# Patient Record
Sex: Female | Born: 1960 | Race: White | Hispanic: No | Smoking: Current every day smoker
Health system: Southern US, Community
[De-identification: ages and names within clinical notes are randomized; demographics above are authoritative.]

## PROBLEM LIST (undated history)

## (undated) DIAGNOSIS — M549 Dorsalgia, unspecified: Secondary | ICD-10-CM

## (undated) DIAGNOSIS — K219 Gastro-esophageal reflux disease without esophagitis: Secondary | ICD-10-CM

## (undated) DIAGNOSIS — H9190 Unspecified hearing loss, unspecified ear: Secondary | ICD-10-CM

## (undated) DIAGNOSIS — J45909 Unspecified asthma, uncomplicated: Secondary | ICD-10-CM

## (undated) DIAGNOSIS — M25569 Pain in unspecified knee: Secondary | ICD-10-CM

## (undated) DIAGNOSIS — F419 Anxiety disorder, unspecified: Secondary | ICD-10-CM

## (undated) DIAGNOSIS — G5600 Carpal tunnel syndrome, unspecified upper limb: Secondary | ICD-10-CM

## (undated) DIAGNOSIS — Z87442 Personal history of urinary calculi: Secondary | ICD-10-CM

## (undated) DIAGNOSIS — E039 Hypothyroidism, unspecified: Secondary | ICD-10-CM

## (undated) DIAGNOSIS — E079 Disorder of thyroid, unspecified: Secondary | ICD-10-CM

## (undated) DIAGNOSIS — I10 Essential (primary) hypertension: Secondary | ICD-10-CM

## (undated) HISTORY — DX: Disorder of thyroid, unspecified: E07.9

## (undated) HISTORY — DX: Essential (primary) hypertension: I10

## (undated) HISTORY — DX: Unspecified hearing loss, unspecified ear: H91.90

## (undated) HISTORY — PX: TUBAL LIGATION: SHX77

## (undated) HISTORY — DX: Pain in unspecified knee: M25.569

---

## 2005-02-06 ENCOUNTER — Emergency Department: Payer: Self-pay | Admitting: Emergency Medicine

## 2011-01-09 ENCOUNTER — Emergency Department: Payer: Self-pay | Admitting: Internal Medicine

## 2011-01-29 ENCOUNTER — Telehealth: Payer: Self-pay | Admitting: Cardiovascular Disease

## 2011-01-29 ENCOUNTER — Ambulatory Visit: Payer: Self-pay | Admitting: Cardiovascular Disease

## 2011-01-29 NOTE — Telephone Encounter (Signed)
LMOM to reschedule missed appointment from 01/29/11.

## 2011-09-11 ENCOUNTER — Ambulatory Visit: Payer: Self-pay | Admitting: "Endocrinology

## 2012-02-09 ENCOUNTER — Encounter: Payer: Self-pay | Admitting: Physical Medicine & Rehabilitation

## 2012-03-01 ENCOUNTER — Ambulatory Visit: Payer: Self-pay | Admitting: Physical Medicine & Rehabilitation

## 2012-03-01 ENCOUNTER — Encounter: Payer: Self-pay | Attending: Physical Medicine & Rehabilitation

## 2012-03-02 LAB — LIPID PANEL
CHOLESTEROL: 229 mg/dL — AB (ref 0–200)
HDL: 43 mg/dL (ref 35–70)
LDL Cholesterol: 115 mg/dL
TRIGLYCERIDES: 356 mg/dL — AB (ref 40–160)

## 2012-03-02 LAB — HEPATIC FUNCTION PANEL
ALK PHOS: 96 U/L (ref 25–125)
ALT: 24 U/L (ref 7–35)
AST: 22 U/L (ref 13–35)
BILIRUBIN, TOTAL: 0.2 mg/dL

## 2012-03-02 LAB — CBC AND DIFFERENTIAL
HCT: 40 % (ref 36–46)
HEMOGLOBIN: 13.3 g/dL (ref 12.0–16.0)
Neutrophils Absolute: 5 /uL
PLATELETS: 374 10*3/uL (ref 150–399)
WBC: 8.8 10^3/mL

## 2012-03-08 ENCOUNTER — Ambulatory Visit: Payer: Self-pay | Admitting: Physical Medicine & Rehabilitation

## 2013-06-17 ENCOUNTER — Emergency Department: Payer: Self-pay | Admitting: Emergency Medicine

## 2013-07-07 LAB — BASIC METABOLIC PANEL
BUN: 11 mg/dL (ref 4–21)
CREATININE: 1 mg/dL (ref 0.5–1.1)
Glucose: 76 mg/dL
POTASSIUM: 3.7 mmol/L (ref 3.4–5.3)
SODIUM: 141 mmol/L (ref 137–147)

## 2013-07-07 LAB — TSH: TSH: 4.08 u[IU]/mL (ref 0.41–5.90)

## 2013-07-29 ENCOUNTER — Ambulatory Visit: Payer: Self-pay | Admitting: Internal Medicine

## 2013-10-18 ENCOUNTER — Emergency Department: Payer: Self-pay | Admitting: Emergency Medicine

## 2013-10-19 LAB — CBC WITH DIFFERENTIAL/PLATELET
Basophil #: 0.2 10*3/uL — ABNORMAL HIGH (ref 0.0–0.1)
Basophil %: 1.1 %
Eosinophil #: 0.3 10*3/uL (ref 0.0–0.7)
Eosinophil %: 1.5 %
HCT: 44.7 % (ref 35.0–47.0)
HGB: 14.7 g/dL (ref 12.0–16.0)
LYMPHS ABS: 8.3 10*3/uL — AB (ref 1.0–3.6)
LYMPHS PCT: 46.8 %
MCH: 28.8 pg (ref 26.0–34.0)
MCHC: 32.9 g/dL (ref 32.0–36.0)
MCV: 88 fL (ref 80–100)
MONOS PCT: 6.9 %
Monocyte #: 1.2 x10 3/mm — ABNORMAL HIGH (ref 0.2–0.9)
NEUTROS PCT: 43.7 %
Neutrophil #: 7.8 10*3/uL — ABNORMAL HIGH (ref 1.4–6.5)
PLATELETS: 515 10*3/uL — AB (ref 150–440)
RBC: 5.11 10*6/uL (ref 3.80–5.20)
RDW: 12.7 % (ref 11.5–14.5)
WBC: 17.7 10*3/uL — ABNORMAL HIGH (ref 3.6–11.0)

## 2013-10-19 LAB — COMPREHENSIVE METABOLIC PANEL
ALK PHOS: 116 U/L
ALT: 30 U/L (ref 12–78)
Albumin: 4.1 g/dL (ref 3.4–5.0)
Anion Gap: 13 (ref 7–16)
BUN: 18 mg/dL (ref 7–18)
Bilirubin,Total: 0.3 mg/dL (ref 0.2–1.0)
CALCIUM: 9.2 mg/dL (ref 8.5–10.1)
Chloride: 101 mmol/L (ref 98–107)
Co2: 24 mmol/L (ref 21–32)
Creatinine: 1.14 mg/dL (ref 0.60–1.30)
EGFR (African American): >60
GFR CALC NON AF AMER: 56 — AB
Glucose: 156 mg/dL — ABNORMAL HIGH (ref 65–99)
OSMOLALITY: 281 (ref 275–301)
POTASSIUM: 3 mmol/L — AB (ref 3.5–5.1)
SGOT(AST): 38 U/L — ABNORMAL HIGH (ref 15–37)
Sodium: 138 mmol/L (ref 136–145)
TOTAL PROTEIN: 8.3 g/dL — AB (ref 6.4–8.2)

## 2013-10-19 LAB — SALICYLATE LEVEL: Salicylates, Serum: 2.4 mg/dL

## 2013-10-19 LAB — DRUG SCREEN, URINE
Amphetamines, Ur Screen: NEGATIVE (ref ?–1000)
BARBITURATES, UR SCREEN: NEGATIVE (ref ?–200)
Benzodiazepine, Ur Scrn: NEGATIVE (ref ?–200)
COCAINE METABOLITE, UR ~~LOC~~: POSITIVE (ref ?–300)
Cannabinoid 50 Ng, Ur ~~LOC~~: NEGATIVE (ref ?–50)
MDMA (Ecstasy)Ur Screen: NEGATIVE (ref ?–500)
Methadone, Ur Screen: NEGATIVE (ref ?–300)
Opiate, Ur Screen: NEGATIVE (ref ?–300)
PHENCYCLIDINE (PCP) UR S: NEGATIVE (ref ?–25)
Tricyclic, Ur Screen: NEGATIVE (ref ?–1000)

## 2013-10-19 LAB — URINALYSIS, COMPLETE
Bilirubin,UR: NEGATIVE
Glucose,UR: NEGATIVE mg/dL (ref 0–75)
Ketone: NEGATIVE
NITRITE: NEGATIVE
PROTEIN: NEGATIVE
Ph: 6 (ref 4.5–8.0)
RBC,UR: 3 /HPF (ref 0–5)
SPECIFIC GRAVITY: 1.003 (ref 1.003–1.030)
Squamous Epithelial: 3

## 2013-10-19 LAB — ACETAMINOPHEN LEVEL: Acetaminophen: <2 ug/mL

## 2013-10-19 LAB — ETHANOL
Ethanol %: 0.041 % (ref 0.000–0.080)
Ethanol: 41 mg/dL

## 2014-05-27 ENCOUNTER — Emergency Department: Payer: Self-pay | Admitting: Emergency Medicine

## 2015-05-19 DIAGNOSIS — I10 Essential (primary) hypertension: Secondary | ICD-10-CM

## 2015-05-19 DIAGNOSIS — E079 Disorder of thyroid, unspecified: Secondary | ICD-10-CM

## 2015-05-30 ENCOUNTER — Ambulatory Visit: Payer: Self-pay | Admitting: Internal Medicine

## 2016-02-14 ENCOUNTER — Emergency Department (HOSPITAL_COMMUNITY): Payer: Medicare Other

## 2016-02-14 ENCOUNTER — Encounter (HOSPITAL_COMMUNITY): Payer: Self-pay | Admitting: Emergency Medicine

## 2016-02-14 ENCOUNTER — Emergency Department (HOSPITAL_COMMUNITY)
Admission: EM | Admit: 2016-02-14 | Discharge: 2016-02-14 | Disposition: A | Discharge status: Home / Self Care | Payer: Medicare Other | Attending: Emergency Medicine | Admitting: Emergency Medicine

## 2016-02-14 DIAGNOSIS — F17210 Nicotine dependence, cigarettes, uncomplicated: Secondary | ICD-10-CM | POA: Diagnosis not present

## 2016-02-14 DIAGNOSIS — M1990 Unspecified osteoarthritis, unspecified site: Secondary | ICD-10-CM | POA: Diagnosis not present

## 2016-02-14 DIAGNOSIS — M159 Polyosteoarthritis, unspecified: Secondary | ICD-10-CM

## 2016-02-14 DIAGNOSIS — Z791 Long term (current) use of non-steroidal anti-inflammatories (NSAID): Secondary | ICD-10-CM | POA: Insufficient documentation

## 2016-02-14 DIAGNOSIS — I10 Essential (primary) hypertension: Secondary | ICD-10-CM | POA: Diagnosis present

## 2016-02-14 DIAGNOSIS — Z79899 Other long term (current) drug therapy: Secondary | ICD-10-CM | POA: Diagnosis not present

## 2016-02-14 HISTORY — DX: Carpal tunnel syndrome, unspecified upper limb: G56.00

## 2016-02-14 HISTORY — DX: Dorsalgia, unspecified: M54.9

## 2016-02-14 MED ORDER — HYDROCHLOROTHIAZIDE 25 MG PO TABS: 25.0000 mg | ORAL_TABLET | Freq: Every day | ORAL | Status: AC

## 2016-02-14 MED ORDER — LISINOPRIL 20 MG PO TABS: 20.0000 mg | ORAL_TABLET | Freq: Every day | ORAL | Status: AC

## 2016-02-14 MED ORDER — LEVOTHYROXINE SODIUM 50 MCG PO TABS: 50.0000 ug | ORAL_TABLET | Freq: Every day | ORAL | Status: AC

## 2016-02-14 MED ORDER — HYDROCHLOROTHIAZIDE 25 MG PO TABS
25.0000 mg | ORAL_TABLET | Freq: Every day | ORAL | Status: DC
  Administered 2016-02-14: 25 mg via ORAL
  Filled 2016-02-14: qty 1

## 2016-02-14 MED ORDER — LISINOPRIL 10 MG PO TABS
20.0000 mg | ORAL_TABLET | Freq: Every day | ORAL | Status: DC
  Administered 2016-02-14: 20 mg via ORAL
  Filled 2016-02-14: qty 2

## 2016-02-14 NOTE — ED Notes (Signed)
PT c/o not having PCP at this time and no HTN medications for over a month and c/o HTN with headache and increased anxiety and neverousness since her husband committed suicide 2 months ago.

## 2016-02-14 NOTE — Discharge Instructions (Signed)
Osteoarthritis Osteoarthritis is a disease that causes soreness and inflammation of a joint. It occurs when the cartilage at the affected joint wears down. Cartilage acts as a cushion, covering the ends of bones where they meet to form a joint. Osteoarthritis is the most common form of arthritis. It often occurs in older people. The joints affected most often by this condition include those in the:  Ends of the fingers.  Thumbs.  Neck.  Lower back.  Knees.  Hips. CAUSES  Over time, the cartilage that covers the ends of bones begins to wear away. This causes bone to rub on bone, producing pain and stiffness in the affected joints.  RISK FACTORS Certain factors can increase your chances of having osteoarthritis, including:  Older age.  Excessive body weight.  Overuse of joints.  Previous joint injury. SIGNS AND SYMPTOMS   Pain, swelling, and stiffness in the joint.  Over time, the joint may lose its normal shape.  Small deposits of bone (osteophytes) may grow on the edges of the joint.  Bits of bone or cartilage can break off and float inside the joint space. This may cause more pain and damage. DIAGNOSIS  Your health care provider will do a physical exam and ask about your symptoms. Various tests may be ordered, such as:  X-rays of the affected joint.  Blood tests to rule out other types of arthritis. Additional tests may be used to diagnose your condition. TREATMENT  Goals of treatment are to control pain and improve joint function. Treatment plans may include:  A prescribed exercise program that allows for rest and joint relief.  A weight control plan.  Pain relief techniques, such as:  Properly applied heat and cold.  Electric pulses delivered to nerve endings under the skin (transcutaneous electrical nerve stimulation [TENS]).  Massage.  Certain nutritional supplements.  Medicines to control pain, such as:  Acetaminophen.  Nonsteroidal  anti-inflammatory drugs (NSAIDs), such as naproxen.  Narcotic or central-acting agents, such as tramadol.  Corticosteroids. These can be given orally or as an injection.  Surgery to reposition the bones and relieve pain (osteotomy) or to remove loose pieces of bone and cartilage. Joint replacement may be needed in advanced states of osteoarthritis. HOME CARE INSTRUCTIONS   Take medicines only as directed by your health care provider.  Maintain a healthy weight. Follow your health care provider's instructions for weight control. This may include dietary instructions.  Exercise as directed. Your health care provider can recommend specific types of exercise. These may include:  Strengthening exercises. These are done to strengthen the muscles that support joints affected by arthritis. They can be performed with weights or with exercise bands to add resistance.  Aerobic activities. These are exercises, such as brisk walking or low-impact aerobics, that get your heart pumping.  Range-of-motion activities. These keep your joints limber.  Balance and agility exercises. These help you maintain daily living skills.  Rest your affected joints as directed by your health care provider.  Keep all follow-up visits as directed by your health care provider. SEEK MEDICAL CARE IF:   Your skin turns red.  You develop a rash in addition to your joint pain.  You have worsening joint pain.  You have a fever along with joint or muscle aches. SEEK IMMEDIATE MEDICAL CARE IF:  You have a significant loss of weight or appetite.  You have night sweats. FOR MORE INFORMATION   National Institute of Arthritis and Musculoskeletal and Skin Diseases: www.niams.nih.gov  National Institute on   Aging: www.nia.nih.gov  American College of Rheumatology: www.rheumatology.org   This information is not intended to replace advice given to you by your health care provider. Make sure you discuss any questions you  have with your health care provider.   Document Released: 09/15/2005 Document Revised: 10/06/2014 Document Reviewed: 05/23/2013 Elsevier Interactive Patient Education 2016 Elsevier Inc. Hypertension Hypertension, commonly called high blood pressure, is when the force of blood pumping through your arteries is too strong. Your arteries are the blood vessels that carry blood from your heart throughout your body. A blood pressure reading consists of a higher number over a lower number, such as 110/72. The higher number (systolic) is the pressure inside your arteries when your heart pumps. The lower number (diastolic) is the pressure inside your arteries when your heart relaxes. Ideally you want your blood pressure below 120/80. Hypertension forces your heart to work harder to pump blood. Your arteries may become narrow or stiff. Having untreated or uncontrolled hypertension can cause heart attack, stroke, kidney disease, and other problems. RISK FACTORS Some risk factors for high blood pressure are controllable. Others are not.  Risk factors you cannot control include:   Race. You may be at higher risk if you are African American.  Age. Risk increases with age.  Gender. Men are at higher risk than women before age 45 years. After age 65, women are at higher risk than men. Risk factors you can control include:  Not getting enough exercise or physical activity.  Being overweight.  Getting too much fat, sugar, calories, or salt in your diet.  Drinking too much alcohol. SIGNS AND SYMPTOMS Hypertension does not usually cause signs or symptoms. Extremely high blood pressure (hypertensive crisis) may cause headache, anxiety, shortness of breath, and nosebleed. DIAGNOSIS To check if you have hypertension, your health care provider will measure your blood pressure while you are seated, with your arm held at the level of your heart. It should be measured at least twice using the same arm. Certain  conditions can cause a difference in blood pressure between your right and left arms. A blood pressure reading that is higher than normal on one occasion does not mean that you need treatment. If it is not clear whether you have high blood pressure, you may be asked to return on a different day to have your blood pressure checked again. Or, you may be asked to monitor your blood pressure at home for 1 or more weeks. TREATMENT Treating high blood pressure includes making lifestyle changes and possibly taking medicine. Living a healthy lifestyle can help lower high blood pressure. You may need to change some of your habits. Lifestyle changes may include:  Following the DASH diet. This diet is high in fruits, vegetables, and whole grains. It is low in salt, red meat, and added sugars.  Keep your sodium intake below 2,300 mg per day.  Getting at least 30-45 minutes of aerobic exercise at least 4 times per week.  Losing weight if necessary.  Not smoking.  Limiting alcoholic beverages.  Learning ways to reduce stress. Your health care provider may prescribe medicine if lifestyle changes are not enough to get your blood pressure under control, and if one of the following is true:  You are 18-59 years of age and your systolic blood pressure is above 140.  You are 60 years of age or older, and your systolic blood pressure is above 150.  Your diastolic blood pressure is above 90.  You have diabetes, and your   systolic blood pressure is over 140 or your diastolic blood pressure is over 90.  You have kidney disease and your blood pressure is above 140/90.  You have heart disease and your blood pressure is above 140/90. Your personal target blood pressure may vary depending on your medical conditions, your age, and other factors. HOME CARE INSTRUCTIONS  Have your blood pressure rechecked as directed by your health care provider.   Take medicines only as directed by your health care provider.  Follow the directions carefully. Blood pressure medicines must be taken as prescribed. The medicine does not work as well when you skip doses. Skipping doses also puts you at risk for problems.  Do not smoke.   Monitor your blood pressure at home as directed by your health care provider. SEEK MEDICAL CARE IF:   You think you are having a reaction to medicines taken.  You have recurrent headaches or feel dizzy.  You have swelling in your ankles.  You have trouble with your vision. SEEK IMMEDIATE MEDICAL CARE IF:  You develop a severe headache or confusion.  You have unusual weakness, numbness, or feel faint.  You have severe chest or abdominal pain.  You vomit repeatedly.  You have trouble breathing. MAKE SURE YOU:   Understand these instructions.  Will watch your condition.  Will get help right away if you are not doing well or get worse.   This information is not intended to replace advice given to you by your health care provider. Make sure you discuss any questions you have with your health care provider.   Document Released: 09/15/2005 Document Revised: 01/30/2015 Document Reviewed: 07/08/2013 Elsevier Interactive Patient Education 2016 Elsevier Inc.  

## 2016-02-14 NOTE — ED Provider Notes (Signed)
CSN: [id number]     Arrival date & time 02/14/16  1630 History   First MD Initiated Contact with Patient 02/14/16 1652     Chief Complaint  Patient presents with  . Hypertension    HPI Patient presents to the emergency room to get refills of her blood pressure medications. Patient has a history of hypertension. Her husband committed suicide 2 months ago.  She was not able to follow-up with her primary care doctor and there were some issues with her insurance.  The patient has not taken her medications for over a month. He has been noticing some increasing headaches. She's also had some issues with anxiety since her husband passed away. She also mentions having some issues with chronic left hip and knee pain that started several years ago. Denies any fevers. No chest pain or shortness of breath. No numbness or weakness. Patient took her blood pressure recently and was very elevated and she realized she needed to get back on her medications so she came to the emergency room because she was not sure where she could go. Past Medical History  Diagnosis Date  . Hypertension   . Knee pain   . Hearing impaired birth    Mother had rubella  . Thyroid disease   . Carpal tunnel syndrome   . Back pain    Past Surgical History  Procedure Laterality Date  . Tubal ligation  20 + years   Family History  Problem Relation Age of Onset  . Hypertension Mother   . Diabetes Mother   . Congestive Heart Failure Mother   . Hypertension Father   . CVA Father   . Hypertension Brother    Social History  Substance Use Topics  . Smoking status: Current Every Day Smoker -- 0.50 packs/day    Types: Cigarettes  . Smokeless tobacco: Never Used  . Alcohol Use: Yes   OB History    Gravida Para Term Preterm AB TAB SAB Ectopic Multiple Living            2     Review of Systems  All other systems reviewed and are negative.     Allergies  Penicillins and Sulfur  Home Medications   Prior to Admission  medications   Medication Sig Start Date End Date Taking? Authorizing Provider  acetaminophen (TYLENOL) 500 MG tablet Take 500 mg by mouth every 6 (six) hours as needed for mild pain or moderate pain.   Yes Historical Provider, MD  ibuprofen (ADVIL,MOTRIN) 200 MG tablet Take 200 mg by mouth every 6 (six) hours as needed for mild pain or moderate pain.   Yes Historical Provider, MD  hydrochlorothiazide (HYDRODIURIL) 25 MG tablet Take 1 tablet (25 mg total) by mouth daily. 02/14/16   Linwood DibblesJon Nicloe Frontera, MD  levothyroxine (SYNTHROID, LEVOTHROID) 50 MCG tablet Take 1 tablet (50 mcg total) by mouth daily before breakfast. 02/14/16   Linwood DibblesJon Story Conti, MD  lisinopril (PRINIVIL,ZESTRIL) 20 MG tablet Take 1 tablet (20 mg total) by mouth daily. 02/14/16   Linwood DibblesJon Krimson Massmann, MD  naproxen (NAPROSYN) 250 MG tablet Take by mouth 3 (three) times daily with meals.    Historical Provider, MD  ranitidine (ZANTAC) 150 MG tablet Take 150 mg by mouth 2 (two) times daily.    Historical Provider, MD   BP 187/96 mmHg  Pulse 88  Temp(Src) 98.4 F (36.9 C) (Oral)  Resp 16  Wt 70.308 kg  SpO2 99% Physical Exam  Constitutional: She appears well-developed and well-nourished. No distress.  HENT:  Head: Normocephalic and atraumatic.  Right Ear: External ear normal.  Left Ear: External ear normal.  Eyes: Conjunctivae are normal. Right eye exhibits no discharge. Left eye exhibits no discharge. No scleral icterus.  Neck: Neck supple. No tracheal deviation present.  Cardiovascular: Normal rate, regular rhythm and intact distal pulses.   Pulmonary/Chest: Effort normal and breath sounds normal. No stridor. No respiratory distress. She has no wheezes. She has no rales.  Abdominal: Soft. Bowel sounds are normal. She exhibits no distension. There is no tenderness. There is no rebound and no guarding.  Musculoskeletal: She exhibits no edema.  Mild tenderness palpation and pain with range of motion of the left hip and left knee  Neurological: She is  alert. She has normal strength. No cranial nerve deficit (no facial droop, extraocular movements intact, no slurred speech) or sensory deficit. She exhibits normal muscle tone. She displays no seizure activity. Coordination normal.  Skin: Skin is warm and dry. No rash noted.  Psychiatric: She has a normal mood and affect.  Nursing note and vitals reviewed.   ED Course  Procedures (including critical care time) Labs Review Labs Reviewed - No data to display  Imaging Review Dg Knee Complete 4 Views Left  02/14/2016  CLINICAL DATA:  Left hip pain radiating to the knee. EXAM: LEFT KNEE - COMPLETE 4+ VIEW COMPARISON:  None. FINDINGS: Sharpening of the tibial spines noted. There is medial compartment narrowing and marginal spur formation. No acute fracture or subluxation identified. IMPRESSION: 1. Osteoarthritis. 2. No acute findings. Electronically Signed   By: Signa Kell M.D.   On: 02/14/2016 17:53   Dg Hip Unilat With Pelvis 2-3 Views Left  02/14/2016  CLINICAL DATA:  Left hip pain EXAM: DG HIP (WITH OR WITHOUT PELVIS) 2-3V LEFT COMPARISON:  None. FINDINGS: There is mild left hip osteoarthritis with joint space narrowing and marginal spur formation. There is no acute fracture or subluxation identified. No radio-opaque foreign body or soft tissue calcification. IMPRESSION: 1. Left hip osteoarthritis. Electronically Signed   By: Signa Kell M.D.   On: 02/14/2016 17:54   I have personally reviewed and evaluated these images and lab results as part of my medical decision-making.    MDM   Final diagnoses:  Essential hypertension  Osteoarthritis of multiple joints, unspecified osteoarthritis type    Patient's x-rays do show evidence of osteoarthritis. Patient can take over-the-counter pain medications.  She has asymptomatic hypertension. She was given a dose of her prior blood pressure medications this evening. I will give her prescriptions for her to take home. [facility] [place], MD 02/14/16 1816

## 2016-05-07 ENCOUNTER — Other Ambulatory Visit (HOSPITAL_COMMUNITY): Payer: Self-pay | Admitting: Family Medicine

## 2016-05-07 DIAGNOSIS — Z1231 Encounter for screening mammogram for malignant neoplasm of breast: Secondary | ICD-10-CM

## 2016-05-14 ENCOUNTER — Ambulatory Visit (HOSPITAL_COMMUNITY): Payer: Medicare Other

## 2016-05-16 ENCOUNTER — Ambulatory Visit (HOSPITAL_COMMUNITY): Payer: Medicare Other

## 2016-05-19 ENCOUNTER — Ambulatory Visit (HOSPITAL_COMMUNITY): Payer: Medicare Other

## 2016-08-29 DIAGNOSIS — Z87442 Personal history of urinary calculi: Secondary | ICD-10-CM

## 2016-08-29 HISTORY — DX: Personal history of urinary calculi: Z87.442

## 2016-10-22 ENCOUNTER — Other Ambulatory Visit: Payer: Self-pay | Admitting: Specialist

## 2016-10-22 DIAGNOSIS — M5416 Radiculopathy, lumbar region: Secondary | ICD-10-CM

## 2016-10-30 ENCOUNTER — Ambulatory Visit
Admission: RE | Admit: 2016-10-30 | Discharge: 2016-10-30 | Disposition: A | Discharge status: Home / Self Care | Payer: Medicare Other | Source: Ambulatory Visit | Attending: Specialist | Admitting: Specialist

## 2016-10-30 DIAGNOSIS — M5126 Other intervertebral disc displacement, lumbar region: Secondary | ICD-10-CM | POA: Insufficient documentation

## 2016-10-30 DIAGNOSIS — M5416 Radiculopathy, lumbar region: Secondary | ICD-10-CM | POA: Diagnosis not present

## 2016-12-22 ENCOUNTER — Ambulatory Visit
Admission: RE | Admit: 2016-12-22 | Discharge: 2016-12-22 | Disposition: A | Discharge status: Home / Self Care | Payer: Medicare Other | Source: Ambulatory Visit | Attending: Neurological Surgery | Admitting: Neurological Surgery

## 2016-12-22 ENCOUNTER — Encounter
Admission: RE | Admit: 2016-12-22 | Discharge: 2016-12-22 | Disposition: A | Discharge status: Home / Self Care | Payer: Medicare Other | Source: Ambulatory Visit | Attending: Neurological Surgery | Admitting: Neurological Surgery

## 2016-12-22 DIAGNOSIS — I700 Atherosclerosis of aorta: Secondary | ICD-10-CM | POA: Diagnosis not present

## 2016-12-22 DIAGNOSIS — Z79899 Other long term (current) drug therapy: Secondary | ICD-10-CM | POA: Insufficient documentation

## 2016-12-22 DIAGNOSIS — I10 Essential (primary) hypertension: Secondary | ICD-10-CM | POA: Diagnosis present

## 2016-12-22 DIAGNOSIS — I447 Left bundle-branch block, unspecified: Secondary | ICD-10-CM | POA: Insufficient documentation

## 2016-12-22 HISTORY — DX: Anxiety disorder, unspecified: F41.9

## 2016-12-22 HISTORY — DX: Unspecified asthma, uncomplicated: J45.909

## 2016-12-22 HISTORY — DX: Gastro-esophageal reflux disease without esophagitis: K21.9

## 2016-12-22 HISTORY — DX: Personal history of urinary calculi: Z87.442

## 2016-12-22 HISTORY — DX: Hypothyroidism, unspecified: E03.9

## 2016-12-22 LAB — CBC
HCT: 42.5 % (ref 35.0–47.0)
Hemoglobin: 14.3 g/dL (ref 12.0–16.0)
MCH: 29.3 pg (ref 26.0–34.0)
MCHC: 33.8 g/dL (ref 32.0–36.0)
MCV: 86.9 fL (ref 80.0–100.0)
PLATELETS: 352 10*3/uL (ref 150–440)
RBC: 4.89 MIL/uL (ref 3.80–5.20)
RDW: 13.5 % (ref 11.5–14.5)
WBC: 9.2 10*3/uL (ref 3.6–11.0)

## 2016-12-22 LAB — PROTIME-INR
INR: 0.93
PROTHROMBIN TIME: 12.5 s (ref 11.4–15.2)

## 2016-12-22 LAB — DIFFERENTIAL
BASOS PCT: 0 %
Basophils Absolute: 0 10*3/uL (ref 0–0.1)
EOS ABS: 0.1 10*3/uL (ref 0–0.7)
Eosinophils Relative: 1 %
Lymphocytes Relative: 30 %
Lymphs Abs: 2.7 10*3/uL (ref 1.0–3.6)
Monocytes Absolute: 0.6 10*3/uL (ref 0.2–0.9)
Monocytes Relative: 7 %
NEUTROS ABS: 5.7 10*3/uL (ref 1.4–6.5)
Neutrophils Relative %: 62 %

## 2016-12-22 LAB — BASIC METABOLIC PANEL
ANION GAP: 8 (ref 5–15)
BUN: 17 mg/dL (ref 6–20)
CALCIUM: 9.8 mg/dL (ref 8.9–10.3)
CO2: 24 mmol/L (ref 22–32)
Chloride: 109 mmol/L (ref 101–111)
Creatinine, Ser: 0.78 mg/dL (ref 0.44–1.00)
GFR calc Af Amer: >60 mL/min (ref >60)
Glucose, Bld: 89 mg/dL (ref 65–99)
POTASSIUM: 3.7 mmol/L (ref 3.5–5.1)
SODIUM: 141 mmol/L (ref 135–145)

## 2016-12-22 LAB — SURGICAL PCR SCREEN
MRSA, PCR: NEGATIVE
Staphylococcus aureus: NEGATIVE

## 2016-12-22 LAB — URINALYSIS, ROUTINE W REFLEX MICROSCOPIC
BILIRUBIN URINE: NEGATIVE
Glucose, UA: NEGATIVE mg/dL
HGB URINE DIPSTICK: NEGATIVE
KETONES UR: NEGATIVE mg/dL
Leukocytes, UA: NEGATIVE
Nitrite: NEGATIVE
PROTEIN: NEGATIVE mg/dL
Specific Gravity, Urine: 1.02 (ref 1.005–1.030)
pH: 5 (ref 5.0–8.0)

## 2016-12-22 LAB — APTT: APTT: 30 s (ref 24–36)
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
## 2016-12-22 NOTE — Patient Instructions (Signed)
  Your procedure is scheduled ZO:XWRUEAon:Monday April 2 , 2018. Report to Same Day Surgery at 9:00 am.   Remember: Instructions that are not followed completely may result in serious medical risk, up to and including death, or upon the discretion of your surgeon and anesthesiologist your surgery may need to be rescheduled.    _x___ 1. Do not eat food or drink liquids after midnight. No gum chewing or hard candies.     _x___ 2. No Alcohol for 24 hours before or after surgery.   ____ 3. Bring all medications with you on the day of surgery if instructed.    __x__ 4. Notify your doctor if there is any change in your medical condition     (cold, fever, infections).    __x___ 5. No smoking 24 hours prior to surgery.     Do not wear jewelry, make-up, hairpins, clips or nail polish.  Do not wear lotions, powders, or perfumes.   Do not shave 48 hours prior to surgery. Men may shave face and neck.  Do not bring valuables to the hospital.    [facility] is not responsible for any belongings or valuables.               Contacts, dentures or bridgework may not be worn into surgery.  Leave your suitcase in the car. After surgery it may be brought to your room.  For patients admitted to the hospital, discharge time is determined by your treatment team.   Patients discharged the day of surgery will not be allowed to drive home.    Please read over the following fact sheets that you were given:   [facility] Pre[facility]  __x__ Take these medicines the morning of surgery with A SIP OF WATER:    1. busPIRone (BUSPAR)  2. levothyroxine (SYNTHROID, LEVOTHROID)   3. lisinopril (PRINIVIL,ZESTRIL)   ____ Fleet Enema (as directed)   _x___ Use CHG Soap as directed on instruction sheet  _x_ Use formoterol (PERFOROMIST)  on the day of surgery and bring to hospital day of surgery  ____ Stop metformin 2 days prior to surgery    ____ Take 1/2 of usual insulin dose the night before surgery and none  on the morning of surgery.   ____ Stop Coumadin/Plavix/aspirin on does not apply.  __x_ Stop Anti-inflammatories such as Advil, Aleve, Ibuprofen, Motrin, Naproxen,  Naprosyn, Goodies powders or aspirin products. OK to take Tylenol.   ____ Stop supplements until after surgery.    ____ Bring C-Pap to the hospital.

## 2016-12-22 NOTE — Pre-Procedure Instructions (Signed)
Spoke with Dr. Randa NgoPiscitello regarding today's EKG compared to EKG done in 2015.  Dr. Randa NgoPiscitello is requesting a medical clearance.  Request sent to Dr. Chipper HerbBarr's office attention Royston CowperKendelyn.  Message left on Kendelyn's voicemail.

## 2016-12-23 NOTE — Pre-Procedure Instructions (Signed)
Message [facility] from [facility] at Dr. Chipper HerbBarr's office, Medical clearance has [facility] and they are awaiting clearance.

## 2016-12-25 NOTE — Pre-Procedure Instructions (Signed)
Clearance note received from Franco Nonesheryl Lindley, FNP, Medium risk.

## 2016-12-28 MED ORDER — FAMOTIDINE 20 MG PO TABS
20.0000 mg | ORAL_TABLET | Freq: Once | ORAL | Status: AC
  Administered 2016-12-29: 20 mg via ORAL

## 2016-12-29 ENCOUNTER — Ambulatory Visit: Payer: Medicare Other | Admitting: Anesthesiology

## 2016-12-29 ENCOUNTER — Encounter: Payer: Self-pay | Admitting: *Deleted

## 2016-12-29 ENCOUNTER — Encounter
Admission: RE | Disposition: A | Discharge status: Home / Self Care | Payer: Self-pay | Source: Ambulatory Visit | Attending: Neurological Surgery

## 2016-12-29 ENCOUNTER — Ambulatory Visit: Payer: Medicare Other

## 2016-12-29 ENCOUNTER — Ambulatory Visit
Admission: RE | Admit: 2016-12-29 | Discharge: 2016-12-29 | Disposition: A | Discharge status: Home / Self Care | Payer: Medicare Other | Source: Ambulatory Visit | Attending: Neurological Surgery | Admitting: Neurological Surgery

## 2016-12-29 DIAGNOSIS — H905 Unspecified sensorineural hearing loss: Secondary | ICD-10-CM | POA: Diagnosis not present

## 2016-12-29 DIAGNOSIS — Z419 Encounter for procedure for purposes other than remedying health state, unspecified: Secondary | ICD-10-CM

## 2016-12-29 DIAGNOSIS — Z7952 Long term (current) use of systemic steroids: Secondary | ICD-10-CM | POA: Insufficient documentation

## 2016-12-29 DIAGNOSIS — Z791 Long term (current) use of non-steroidal anti-inflammatories (NSAID): Secondary | ICD-10-CM | POA: Insufficient documentation

## 2016-12-29 DIAGNOSIS — K219 Gastro-esophageal reflux disease without esophagitis: Secondary | ICD-10-CM | POA: Insufficient documentation

## 2016-12-29 DIAGNOSIS — I10 Essential (primary) hypertension: Secondary | ICD-10-CM | POA: Diagnosis not present

## 2016-12-29 DIAGNOSIS — F17200 Nicotine dependence, unspecified, uncomplicated: Secondary | ICD-10-CM | POA: Diagnosis not present

## 2016-12-29 DIAGNOSIS — E039 Hypothyroidism, unspecified: Secondary | ICD-10-CM | POA: Insufficient documentation

## 2016-12-29 DIAGNOSIS — F419 Anxiety disorder, unspecified: Secondary | ICD-10-CM | POA: Insufficient documentation

## 2016-12-29 DIAGNOSIS — Z79899 Other long term (current) drug therapy: Secondary | ICD-10-CM | POA: Insufficient documentation

## 2016-12-29 DIAGNOSIS — M5116 Intervertebral disc disorders with radiculopathy, lumbar region: Secondary | ICD-10-CM | POA: Diagnosis not present

## 2016-12-29 HISTORY — PX: LUMBAR LAMINECTOMY/DECOMPRESSION MICRODISCECTOMY: SHX5026

## 2016-12-29 SURGERY — LUMBAR LAMINECTOMY/DECOMPRESSION MICRODISCECTOMY 1 LEVEL
Anesthesia: General | Site: Back | Laterality: Left

## 2016-12-29 MED ORDER — FENTANYL CITRATE (PF) 100 MCG/2ML IJ SOLN
INTRAMUSCULAR | Status: AC
  Administered 2016-12-29: 25 ug via INTRAVENOUS
  Filled 2016-12-29: qty 2

## 2016-12-29 MED ORDER — PROMETHAZINE HCL 25 MG/ML IJ SOLN: 6.2500 mg | INTRAMUSCULAR | Status: DC | PRN

## 2016-12-29 MED ORDER — SODIUM CHLORIDE 0.9 % IJ SOLN
INTRAMUSCULAR | Status: AC
  Filled 2016-12-29: qty 10

## 2016-12-29 MED ORDER — MEPERIDINE HCL 50 MG/ML IJ SOLN: 6.2500 mg | INTRAMUSCULAR | Status: DC | PRN

## 2016-12-29 MED ORDER — PROPOFOL 500 MG/50ML IV EMUL
INTRAVENOUS | Status: AC
  Filled 2016-12-29: qty 50

## 2016-12-29 MED ORDER — OXYCODONE-ACETAMINOPHEN 7.5-325 MG PO TABS: 1.0000 | ORAL_TABLET | ORAL | 0 refills | Status: AC | PRN

## 2016-12-29 MED ORDER — METHYLPREDNISOLONE ACETATE 40 MG/ML IJ SUSP
INTRAMUSCULAR | Status: DC | PRN
  Administered 2016-12-29: 40 mg

## 2016-12-29 MED ORDER — OXYCODONE HCL 5 MG PO TABS
ORAL_TABLET | ORAL | Status: AC
  Filled 2016-12-29: qty 1

## 2016-12-29 MED ORDER — SODIUM CHLORIDE 0.9 % IV SOLN
INTRAVENOUS | Status: DC | PRN
  Administered 2016-12-29: 20 ug/min via INTRAVENOUS

## 2016-12-29 MED ORDER — EPHEDRINE SULFATE 50 MG/ML IJ SOLN
INTRAMUSCULAR | Status: DC | PRN
  Administered 2016-12-29: 100 mg via INTRAVENOUS

## 2016-12-29 MED ORDER — LACTATED RINGERS IV SOLN
INTRAVENOUS | Status: DC | PRN
  Administered 2016-12-29: 10:00:00 via INTRAVENOUS

## 2016-12-29 MED ORDER — MIDAZOLAM HCL 2 MG/2ML IJ SOLN
INTRAMUSCULAR | Status: DC | PRN
  Administered 2016-12-29 (×2): 1 mg via INTRAVENOUS

## 2016-12-29 MED ORDER — BUPIVACAINE-EPINEPHRINE (PF) 0.25% -1:200000 IJ SOLN
INTRAMUSCULAR | Status: DC | PRN
  Administered 2016-12-29: 10 mL

## 2016-12-29 MED ORDER — VANCOMYCIN HCL IN DEXTROSE 1-5 GM/200ML-% IV SOLN
INTRAVENOUS | Status: AC
  Administered 2016-12-29: 1000 mg via INTRAVENOUS
  Filled 2016-12-29: qty 200

## 2016-12-29 MED ORDER — SUCCINYLCHOLINE CHLORIDE 20 MG/ML IJ SOLN
INTRAMUSCULAR | Status: DC | PRN
  Administered 2016-12-29: 120 mg via INTRAVENOUS

## 2016-12-29 MED ORDER — THROMBIN 5000 UNITS EX SOLR
CUTANEOUS | Status: AC
  Filled 2016-12-29: qty 5000

## 2016-12-29 MED ORDER — VANCOMYCIN HCL IN DEXTROSE 1-5 GM/200ML-% IV SOLN
1000.0000 mg | INTRAVENOUS | Status: AC
  Administered 2016-12-29: 1000 mg via INTRAVENOUS

## 2016-12-29 MED ORDER — REMIFENTANIL HCL 1 MG IV SOLR
INTRAVENOUS | Status: AC
  Filled 2016-12-29: qty 1000

## 2016-12-29 MED ORDER — BACITRACIN 50000 UNITS IM SOLR
INTRAMUSCULAR | Status: AC
  Filled 2016-12-29: qty 1

## 2016-12-29 MED ORDER — BACITRACIN 50000 UNITS IM SOLR
INTRAMUSCULAR | Status: DC | PRN
  Administered 2016-12-29: 5000 [IU]

## 2016-12-29 MED ORDER — OXYCODONE HCL 5 MG/5ML PO SOLN: 5.0000 mg | Freq: Once | ORAL | Status: AC | PRN

## 2016-12-29 MED ORDER — SUGAMMADEX SODIUM 200 MG/2ML IV SOLN
INTRAVENOUS | Status: DC | PRN
  Administered 2016-12-29: 153.4 mg via INTRAVENOUS

## 2016-12-29 MED ORDER — FENTANYL CITRATE (PF) 100 MCG/2ML IJ SOLN
25.0000 ug | INTRAMUSCULAR | Status: DC | PRN
  Administered 2016-12-29 (×4): 25 ug via INTRAVENOUS

## 2016-12-29 MED ORDER — SEVOFLURANE IN SOLN
RESPIRATORY_TRACT | Status: AC
  Filled 2016-12-29: qty 250

## 2016-12-29 MED ORDER — OXYCODONE HCL 5 MG PO TABS
5.0000 mg | ORAL_TABLET | Freq: Once | ORAL | Status: AC | PRN
  Administered 2016-12-29: 5 mg via ORAL

## 2016-12-29 MED ORDER — MIDAZOLAM HCL 2 MG/2ML IJ SOLN
INTRAMUSCULAR | Status: AC
  Filled 2016-12-29: qty 2

## 2016-12-29 MED ORDER — PROPOFOL 10 MG/ML IV BOLUS
INTRAVENOUS | Status: DC | PRN
  Administered 2016-12-29: 120 mg via INTRAVENOUS

## 2016-12-29 MED ORDER — LACTATED RINGERS IV SOLN
INTRAVENOUS | Status: DC
  Administered 2016-12-29: 10:00:00 via INTRAVENOUS

## 2016-12-29 MED ORDER — LIDOCAINE HCL (CARDIAC) 20 MG/ML IV SOLN
INTRAVENOUS | Status: DC | PRN
  Administered 2016-12-29: 40 mg via INTRAVENOUS

## 2016-12-29 MED ORDER — PROPOFOL 500 MG/50ML IV EMUL
INTRAVENOUS | Status: DC | PRN
  Administered 2016-12-29: 150 ug/kg/min via INTRAVENOUS

## 2016-12-29 MED ORDER — LACTATED RINGERS IV SOLN
INTRAVENOUS | Status: DC | PRN
  Administered 2016-12-29 (×2): via INTRAVENOUS

## 2016-12-29 MED ORDER — FENTANYL CITRATE (PF) 100 MCG/2ML IJ SOLN
INTRAMUSCULAR | Status: AC
  Filled 2016-12-29: qty 2

## 2016-12-29 MED ORDER — PROPOFOL 10 MG/ML IV BOLUS
INTRAVENOUS | Status: AC
  Filled 2016-12-29: qty 20

## 2016-12-29 MED ORDER — SUGAMMADEX SODIUM 200 MG/2ML IV SOLN
INTRAVENOUS | Status: AC
  Filled 2016-12-29: qty 2

## 2016-12-29 MED ORDER — PHENYLEPHRINE HCL 10 MG/ML IJ SOLN
INTRAMUSCULAR | Status: DC | PRN
  Administered 2016-12-29 (×2): 100 ug via INTRAVENOUS

## 2016-12-29 MED ORDER — REMIFENTANIL HCL 1 MG IV SOLR
INTRAVENOUS | Status: DC | PRN
  Administered 2016-12-29: .1 ug/kg/min via INTRAVENOUS

## 2016-12-29 MED ORDER — THROMBIN 5000 UNITS EX SOLR
CUTANEOUS | Status: DC | PRN
  Administered 2016-12-29: 5000 [IU] via TOPICAL

## 2016-12-29 MED ORDER — ACETAMINOPHEN 10 MG/ML IV SOLN
INTRAVENOUS | Status: AC
  Filled 2016-12-29: qty 100

## 2016-12-29 MED ORDER — FENTANYL CITRATE (PF) 100 MCG/2ML IJ SOLN
INTRAMUSCULAR | Status: DC | PRN
  Administered 2016-12-29 (×2): 50 ug via INTRAVENOUS

## 2016-12-29 MED ORDER — ONDANSETRON HCL 4 MG/2ML IJ SOLN
INTRAMUSCULAR | Status: DC | PRN
  Administered 2016-12-29: 4 mg via INTRAVENOUS

## 2016-12-29 MED ORDER — ACETAMINOPHEN 10 MG/ML IV SOLN
INTRAVENOUS | Status: DC | PRN
  Administered 2016-12-29: 1000 mg via INTRAVENOUS

## 2016-12-29 MED ORDER — FAMOTIDINE 20 MG PO TABS
ORAL_TABLET | ORAL | Status: AC
  Administered 2016-12-29: 20 mg via ORAL
  Filled 2016-12-29: qty 1

## 2016-12-29 MED ORDER — BUPIVACAINE-EPINEPHRINE (PF) 0.25% -1:200000 IJ SOLN
INTRAMUSCULAR | Status: AC
  Filled 2016-12-29: qty 30

## 2016-12-29 MED ORDER — METHYLPREDNISOLONE ACETATE 40 MG/ML IJ SUSP
INTRAMUSCULAR | Status: AC
  Filled 2016-12-29: qty 1

## 2016-12-29 SURGICAL SUPPLY — 52 items
BUR NEURO DRILL SOFT 3.0X3.8M (BURR) ×3 IMPLANT
CANISTER SUCT 1200ML W/VALVE (MISCELLANEOUS) ×6 IMPLANT
CHLORAPREP W/TINT 26ML (MISCELLANEOUS) ×3 IMPLANT
CNTNR SPEC 2.5X3XGRAD LEK (MISCELLANEOUS) ×1
CONT SPEC 4OZ STER OR WHT (MISCELLANEOUS) ×2
CONTAINER SPEC 2.5X3XGRAD LEK (MISCELLANEOUS) ×1 IMPLANT
COUNTER NEEDLE 20/40 LG (NEEDLE) ×3 IMPLANT
COVER LIGHT HANDLE STERIS (MISCELLANEOUS) ×6 IMPLANT
DERMABOND ADVANCED (GAUZE/BANDAGES/DRESSINGS) ×2
DERMABOND ADVANCED .7 DNX12 (GAUZE/BANDAGES/DRESSINGS) ×1 IMPLANT
DRAPE C-ARM 42X72 X-RAY (DRAPES) ×6 IMPLANT
DRAPE C-ARMOR (DRAPES) IMPLANT
DRAPE LAPAROTOMY 100X77 ABD (DRAPES) ×3 IMPLANT
DRAPE MICROSCOPE SPINE 48X150 (DRAPES) ×3 IMPLANT
DRAPE POUCH INSTRU U-SHP 10X18 (DRAPES) ×3 IMPLANT
DRAPE SURG 17X11 SM STRL (DRAPES) ×12 IMPLANT
ELECT CAUTERY BLADE TIP 2.5 (TIP) ×3
ELECT EZSTD 165MM 6.5IN (MISCELLANEOUS) ×3
ELECTRODE CAUTERY BLDE TIP 2.5 (TIP) ×1 IMPLANT
ELECTRODE EZSTD 165MM 6.5IN (MISCELLANEOUS) ×1 IMPLANT
FEE INTRAOP MONITOR IMPULS NCS (MISCELLANEOUS) ×1 IMPLANT
GLOVE BIO SURGEON STRL SZ7.5 (GLOVE) ×6 IMPLANT
GLOVE BIOGEL PI IND STRL 8 (GLOVE) ×1 IMPLANT
GLOVE BIOGEL PI INDICATOR 8 (GLOVE) ×2
GOWN STRL REUS W/ TWL LRG LVL3 (GOWN DISPOSABLE) ×1 IMPLANT
GOWN STRL REUS W/ TWL XL LVL3 (GOWN DISPOSABLE) ×1 IMPLANT
GOWN STRL REUS W/TWL LRG LVL3 (GOWN DISPOSABLE) ×2
GOWN STRL REUS W/TWL XL LVL3 (GOWN DISPOSABLE) ×2
GRADUATE 1200CC STRL 31836 (MISCELLANEOUS) ×3 IMPLANT
INTRAOP MONITOR FEE IMPULS NCS (MISCELLANEOUS) ×1
INTRAOP MONITOR FEE IMPULSE (MISCELLANEOUS) ×2
KIT SPINAL PRONEVIEW (KITS) ×2 IMPLANT
KNIFE BAYONET SHORT DISCETOMY (MISCELLANEOUS) ×2 IMPLANT
MARKER SKIN DUAL TIP RULER LAB (MISCELLANEOUS) ×6 IMPLANT
NDL SAFETY ECLIPSE 18X1.5 (NEEDLE) ×3 IMPLANT
NEEDLE HYPO 18GX1.5 SHARP (NEEDLE) ×6
NS IRRIG 1000ML POUR BTL (IV SOLUTION) ×3 IMPLANT
PACK LAMINECTOMY NEURO (CUSTOM PROCEDURE TRAY) ×3 IMPLANT
PROBE MONO 100X0.75 ELECT 1.9M (MISCELLANEOUS) ×3 IMPLANT
SPOGE SURGIFLO 8M (HEMOSTASIS) ×4
SPONGE SURGIFLO 8M (HEMOSTASIS) ×2 IMPLANT
SUT MNCRL AB 3-0 PS2 27 (SUTURE) IMPLANT
SUT NURALON 4 0 TR CR/8 (SUTURE) IMPLANT
SUT VIC AB 2-0 CT1 18 (SUTURE) ×3 IMPLANT
SUT VICRYL 0 UR6 27IN ABS (SUTURE) ×6 IMPLANT
SYR 3ML LL SCALE MARK (SYRINGE) ×3 IMPLANT
SYRINGE 10CC LL (SYRINGE) ×3 IMPLANT
TOWEL OR 17X26 4PK STRL BLUE (TOWEL DISPOSABLE) ×9 IMPLANT
TUBE METRX 18MMX5CM (INSTRUMENTS) ×3 IMPLANT
TUBING CONNECTING 10 (TUBING) ×2 IMPLANT
TUBING CONNECTING 10' (TUBING) ×1
WATER STERILE IRR 1000ML POUR (IV SOLUTION) ×3 IMPLANT

## 2016-12-29 NOTE — Transfer of Care (Signed)
Immediate Anesthesia Transfer of Care Note  Patient: Darlene Villa  Procedure(s) Performed: Procedure(s): left L3-4 far lateral MIS microdiscectomy (Left)  Patient Location: PACU  Anesthesia Type:General  Level of Consciousness: awake  Airway & Oxygen Therapy: Patient Spontanous Breathing and Patient connected to face mask oxygen  Post-op Assessment: Report given to RN and Post -op Vital signs reviewed and stable  Post vital signs: Reviewed and stable  Last Vitals:  Vitals:   12/29/16 0905 12/29/16 1215  BP: (!) 142/75 123/72  Pulse: 82 77  Resp: 16 15  Temp: 36.6 C 36.5 C    Last Pain:  Vitals:   12/29/16 0905  TempSrc: Oral  PainSc: 10-Worst pain ever         Complications: No apparent anesthesia complications

## 2016-12-29 NOTE — Discharge Instructions (Signed)

## 2016-12-29 NOTE — Anesthesia Post-op Follow-up Note (Cosign Needed)
Anesthesia QCDR form completed.        

## 2016-12-29 NOTE — Op Note (Signed)
Date of Surgery: 12/29/2016  Attending: Dr. Teola Bradley  Assistant: Anabel Halon, PA  Preoperative Diagnosis: L3 left lumbar radiculopathy with far lateral HNP at L3/4 failed conservative measures  Postoperative Diagnosis: same  Procedure: Left MIS L3/4 Far lateral microdiscectomy with EMG stimulation.  Anesthesia: GETA + 10cc 0.25% Bupivicaine  Specimen: none  EBL: 10cc  Crystalloid: 800cc  Antibiotics:  Vancomycin 1g  Complications: none noted intraop  Indications: persistent L3 radiculopathy failed conservative measures with HNP noted far lateral L3/4  Findings: large soft HNP along exiting nerve root of L3  Risks of surgery discussed include: infection, bleeding, stroke, coma, death, paralysis, CSF leak, nerve/spinal cord injury, numbness, tingling, weakness, complex regional pain syndrome, recurrent stenosis and/or disc herniation, vascular injury, development of instability, neck/back pain, need for further surgery, persistent symptoms, development of deformity, and the risks of anesthesia. They understood these risks and have agreed to proceed.  Operative Note:   The patient was then brought from the preoperative center with intravenous access established.  They underwent general anesthesia and endotracheal tube intubation.  They were then rotated on the [place] rail top where all pressure points were appropriately padded.  The skin was then thoroughly cleansed.  Perioperative antibiotic prophylaxis was administered.  Sterile prep and drapes were then applied and a timeout was then observed.  C-arm was brought into the field under sterile conditions and under AP visualization with the use of a K wire we established anatomic view the midline spinous process established with a marking pen as well as the lateral pedicle boundaries which was approximately 2.5cm off midline.  Once these lines were established we then rotated into the lateral position and the corresponding level was  identified counting from the sacrum with the use of a local anesthetic needle.  Once this was complete a 2 cm incision was opened with the use of a #10 blade knife at the level of L3/4.  The Metrx tubes were sequentially advanced under live lateral fluoroscopy until a #18 tube by 50 mm depth was established the final tube was then locked in place to the bed side attachment.  Fluoroscopy was then removed from the field.  The microscope was then sterilely brought into the field and muscle creep was hemostased with a bipolar and resected with a pituitary rongeur.  A Bovie extender was then used to expose the lateral pars  Lamina as well as superior articulating process of the level below. Both transverse process of L3 and L4 could be palpated with a 4 penfield.   Careful attention was placed to not violate the facet capsule. A 3 mm matchstick drill bit was then used to shave the lateral pars and superior articulating tip of L4 til the transverse fascia was visualized. The SAP tip was further removed with a 2-0 Kerrison. A 6-0 up angled curette was then used to open the fascia. A small facet artery was noted and bipolared. EMG stimulation probe was then brought into the field and stimulated where the nerve root was suspected. It was noted to be under substantial tension. A right angled ball tip feeler was passed beneath the nerve root and multiple soft disc fragments were removed. This relaxed the nerve substantially.  Once this was complete a nerve root retractor was used to mobilize this medially.  The venous plexus was hemostased with Surgifoam and light bipolar use.  #15 blade knife was then used to make a small annulotomy within the disc space and furhter  disc space contents were noted  to to come through the annulus.  This was resected with a pituitary rongeur.  A 6-0 up-angled curette was passed within the disc space to further mobilize disc space fragments as well as a right angle micro ball-tipped feeler.   Further displace contents were removed in similar fashion with a pituitary rongeur.  Once the nerve root was noted to be relaxed and under less tension the ball-tipped feeler was passed along the foramen distally to to ensure no residual compression was noted.  With none noted attention was then turned to closure.  Prior to this a Valsalva maneuver was performed and no evidence of CSF leak was noted.  A Depo-Medrol soaked Gelfoam pledget was placed along the annulotomy.  The tube system was then removed under microscopic visualization and hemostasis was obtained with a bipolar.  The fascial layer was reapproximated with the use of a 0- Vicryl suture.  Subcutaneous tissue layer was reapproximated using 2-0 Vicryl suture.  The skin was then cleansed and Dermabond was used to close the skin opening.  Patient was then rotated back to the preoperative bed awakened from anesthesia and taken to recovery all counts are correct in this case.   Noralee Stain, MD

## 2016-12-29 NOTE — Anesthesia Procedure Notes (Signed)
Procedure Name: Intubation Performed by: Cherysh Epperly Pre-anesthesia Checklist: Patient identified, Emergency Drugs available, Suction available, Patient being monitored and Timeout performed Patient Re-evaluated:Patient Re-evaluated prior to inductionOxygen Delivery Method: Circle system utilized Preoxygenation: Pre-oxygenation with 100% oxygen Intubation Type: IV induction Ventilation: Mask ventilation without difficulty Laryngoscope Size: Mac and 3 Grade View: Grade II Tube type: Oral Tube size: 7.0 mm Number of attempts: 1 Airway Equipment and Method: Stylet Placement Confirmation: ETT inserted through vocal cords under direct vision,  positive ETCO2 and breath sounds checked- equal and bilateral Secured at: 21 cm Tube secured with: Tape Dental Injury: Teeth and Oropharynx as per pre-operative assessment

## 2016-12-29 NOTE — OR Nursing (Signed)
Pt ambulatory to BR for viod @ 1410,  toler well.

## 2016-12-29 NOTE — Progress Notes (Signed)
Pharmacy consult for Vancomycin dose for Surgical prophylaxis:  56 yo female Ht 89ft3in  Wt= 76.7kg  Scr 0.78 Crcl 79.7 ml/min  PCN allergy  Will order Vancomycin 1 gram IV x 1 for surgical prophylaxis  Bari Mantis PharmD Clinical Pharmacist 12/29/2016

## 2016-12-29 NOTE — H&P (Signed)
Referring Physician:  Altamese Cabal, PA [address] [place], [place] [address]  Primary Physician:  Darlene Schimke, MD  Chief Complaint: 2 year history of back and left leg pain  History of Present Illness: Darlene Villa is a 56 y.o. female who presents with the chief complaint of back and left leg pain that radiates to her thigh for 2 years. This started after helping move her ailing father. She denies any right leg symptoms. She denies any numbness or loss of bowel bladder function. The pain radiates to the anterior aspect of her left leg and extends just beyond the knee. It mildly improves with lying flat but worsens with any motion. She denies any weakness but has any pain when trying to ambulate or stand for long periods of time. She has not had epidural steroid injections or physical therapy since this started. She has not had any steroid tapers to date. She states rarely does the pain extend below the knee but at that time she has felt her foot clench up in a cramped position and then self resolves. She does use tobacco approximately 1 pack per week. She has not had any spine surgery in the past  Review of Systems:  A 10 point review of systems is negative, except for the pertinent positives and negatives detailed in the HPI.  Past Medical History: No past medical history on file.  Past Surgical History: No past surgical history on file.  Allergies: Allergies as of 11/17/2016  . (Not on File)   Medications: Outpatient Encounter Prescriptions as of 11/17/2016  Medication Sig Dispense Refill  . atorvastatin (LIPITOR) 40 MG tablet Take 40 mg by mouth nightly. 3  . baclofen (LIORESAL) 10 MG tablet Take 10 mg by mouth every 12 (twelve) hours as needed. 2  . busPIRone (BUSPAR) 30 MG tablet Take 30 mg by mouth 2 (two) times daily. 3  . hydroCHLOROthiazide (HYDRODIURIL) 25 MG tablet Take 25 mg by mouth once daily. 4  . HYDROcodone-acetaminophen (NORCO) 5-325 mg tablet TAKE  1 TABLET BY MOUTH EVERY 6 TO 8 HOURS AS NEEDED 0  . levothyroxine (SYNTHROID, LEVOTHROID) 50 MCG tablet Take 50 mcg by mouth every morning. 1  . lisinopril (PRINIVIL,ZESTRIL) 20 MG tablet Take 20 mg by mouth once daily. 4  . meloxicam (MOBIC) 7.5 MG tablet Take 7.5 mg by mouth 2 (two) times daily. 1  . nabumetone (RELAFEN) 750 MG tablet Take 750 mg by mouth every 12 (twelve) hours as needed. 0  . niFEdipine (PROCARDIA-XL) 30 MG (OSM) XL tablet Take 30 mg by mouth once daily. 0  . PERFOROMIST 20 mcg/2 mL nebulizer solution INHALE 1 VIAL VIA NEBULIZER TWO TIMES A DAY 5  . predniSONE (DELTASONE) 20 MG tablet Take 20 mg by mouth once daily. 0  . predniSONE 5 mg DsPk USE AS DIRECTED 0  . traMADol (ULTRAM) 50 mg tablet TAKE 1 TABLET(S) EVERY 6 HOURS BY ORAL ROUTE. 0   No facility-administered encounter medications on file as of 11/17/2016.   Social History: Social History  Substance Use Topics  . Smoking status: Not on file  . Smokeless tobacco: Not on file  . Alcohol use Not on file   Family Medical History: No family history on file.  Physical Examination: There were no vitals filed for this visit.  General: Patient is well developed, well nourished, calm, collected, and in no apparent distress.  Psychiatric: Patient is non-anxious.  Head: Pupils equal, round, and reactive to light.  ENT:  Oral mucosa appears well hydrated.  Neck: Supple. Full range of motion.  Respiratory: Patient is breathing without any difficulty.  Extremities: No edema.  Vascular: Palpable pulses.  Skin: On exposed skin, there are no abnormal skin lesions.  NEUROLOGICAL:  General: In no acute distress.  Awake, alert, oriented to person, place, and time. Pupils equal round and reactive to light. Facial tone is symmetric. Tongue protrusion is midline. There is no pronator drift.  ROM of spine: full. Palpation of spine: no TTP SLR + on left.   Strength: Side Biceps Triceps Deltoid Interossei Grip Wrist  Ext. Wrist Flex.  R L Side Iliopsoas Quads Hamstring PF DF EHL  R L 5 4+ Reflexes are 2+ and symmetric at the biceps, triceps, brachioradialis, patella and achilles. Bilateral upper and lower extremity sensation is intact to light touch and pin prick. Clonus is not present. Toes are down-going. Gait is normal. Hoffman's is absent.  Chest: Clear bilaterally CV: S1/S2. RRR  Imaging: MRI Lumbar Spine: EXAM: MRI LUMBAR SPINE WITHOUT CONTRAST  TECHNIQUE: Multiplanar, multisequence MR imaging of the lumbar spine was performed. No intravenous contrast was administered.  COMPARISON: None.  FINDINGS: Segmentation: Standard.  Alignment: Maintained.  Vertebrae: Height and signal are unremarkable.  Conus medullaris: Extends to the L1 level and appears normal.  Paraspinal and other soft tissues: Negative.  Disc levels:  T11-12 and T12-L1 are imaged in the sagittal plane only and negative.  L1-2: Negative.  L2-3: Protrusion just beyond the left foramen contacts the exited left L2 root without compression or displacement. The central canal and right foramina are widely patent.  L3-4: Prominent protrusion in the periphery of the left foramen impinges on the exiting left L3 root. The central canal and right foramen are open.  L4-5: Left foraminal protrusion contacts the exiting left L4 root but does not appear to compress or displacement. The central canal and right foramina are open.  L5-S1: There is some facet degenerative change. This level is otherwise negative.  IMPRESSION: Dominant finding is a prominent protrusion in the periphery of the left foramen at L3-4 impinging on the left L3 root.  Small protrusion beyond the left foramen at L2-3 contacts the exited left L2 root without compression or displacement.  Small left foraminal protrusion at L4-5 causes moderate foraminal narrowing.   I  have personally reviewed the images and agree with the above interpretation.  Assessment and Plan: [name] is a pleasant 56 y.o. female with evidence of a left lumbar L3 radiculopathy based on clinical exam and subjective symptoms. She does have imaging of a far lateral disc on the left L3-4 disc space affecting the exiting nerve root of L3. She has trace weakness in her left quadricep and knee extensor and intact sensation. I discussed options of management both conservative and surgical. From a conservative measure standpoint would favor a left transforaminal epidural steroid injection at L3 and begin a Medrol Dosepak and Neurontin 300 mg nightly which she has tried but without long term relief. Thus I have offeredr a left minimally invasive approach to a far lateral disc at the L3-4 disc space. I did discuss with her that she has other smaller disc protrusions at other levels and counseled smoking cessation and ergonomics of posture and movement/heavy lifting.  Patient had opportunity to ask questions and all were  answered through an interpreter provided. Labs reviewed and are appropriate to proceed.   Thank you for involving me in the care of this patient. I will keep you apprised of the patient's progress.

## 2016-12-29 NOTE — Anesthesia Postprocedure Evaluation (Signed)
Anesthesia Post Note  Patient: Darlene Villa  Procedure(s) Performed: Procedure(s) (LRB): left L3-4 far lateral MIS microdiscectomy (Left)  Patient location during evaluation: PACU Anesthesia Type: General Level of consciousness: awake and alert Pain management: pain level controlled Vital Signs Assessment: post-procedure vital signs reviewed and stable Respiratory status: spontaneous breathing, nonlabored ventilation, respiratory function stable and patient connected to nasal cannula oxygen Cardiovascular status: blood pressure returned to baseline and stable Postop Assessment: no signs of nausea or vomiting Anesthetic complications: no     Last Vitals:  Vitals:   12/29/16 1300 12/29/16 1315  BP: (!) 100/59 (!) 116/94  Pulse: 83 83  Resp: (!) 44 18  Temp:  36.5 C    Last Pain:  Vitals:   12/29/16 1330  TempSrc:   PainSc: 5                  Anacleto Batterman S

## 2016-12-29 NOTE — Anesthesia Preprocedure Evaluation (Addendum)
Anesthesia Evaluation  Patient identified by MRN, date of birth, ID band Patient awake    Reviewed: Allergy & Precautions, NPO status , Patient's Chart, lab work & pertinent test results  History of Anesthesia Complications Negative for: history of anesthetic complications  Airway Mallampati: II  TM Distance: >3 FB Neck ROM: Full    Dental no notable dental hx.    Pulmonary asthma , Current Smoker,    breath sounds clear to auscultation- rhonchi (-) wheezing      Cardiovascular Exercise Tolerance: Good hypertension, Pt. on medications (-) CAD and (-) Past MI  Rhythm:Regular Rate:Normal - Systolic murmurs and - Diastolic murmurs    Neuro/Psych Anxiety Congenital deafness     GI/Hepatic Neg liver ROS, GERD  ,  Endo/Other  neg diabetesHypothyroidism   Renal/GU negative Renal ROS     Musculoskeletal negative musculoskeletal ROS (+)   Abdominal (+) - obese,   Peds  Hematology negative hematology ROS (+)   Anesthesia Other Findings Past Medical History: No date: Anxiety No date: Asthma No date: Back pain No date: Carpal tunnel syndrome No date: GERD (gastroesophageal reflux disease) birth: Hearing impaired     Comment: Mother had rubella 08/2016: History of kidney stones No date: Hypertension No date: Hypothyroidism No date: Knee pain No date: Thyroid disease   Reproductive/Obstetrics                             Anesthesia Physical Anesthesia Plan  ASA: III  Anesthesia Plan: General   Post-op Pain Management:    Induction: Intravenous  Airway Management Planned: Oral ETT  Additional Equipment:   Intra-op Plan:   Post-operative Plan: Extubation in OR  Informed Consent: I have reviewed the patients History and Physical, chart, labs and discussed the procedure including the risks, benefits and alternatives for the proposed anesthesia with the patient or authorized  representative who has indicated his/her understanding and acceptance.   Dental advisory given  Plan Discussed with: CRNA and Anesthesiologist  Anesthesia Plan Comments:        Anesthesia Quick Evaluation

## 2016-12-30 ENCOUNTER — Encounter: Payer: Self-pay | Admitting: Neurological Surgery

## 2017-04-19 IMAGING — DX DG KNEE COMPLETE 4+V*L*
5 series · 5 of 5 positions shown · non-contrast
Comparison: None.

CLINICAL DATA: Left hip pain radiating to the knee.

EXAM:
LEFT KNEE - COMPLETE 4+ VIEW

[knee ap]
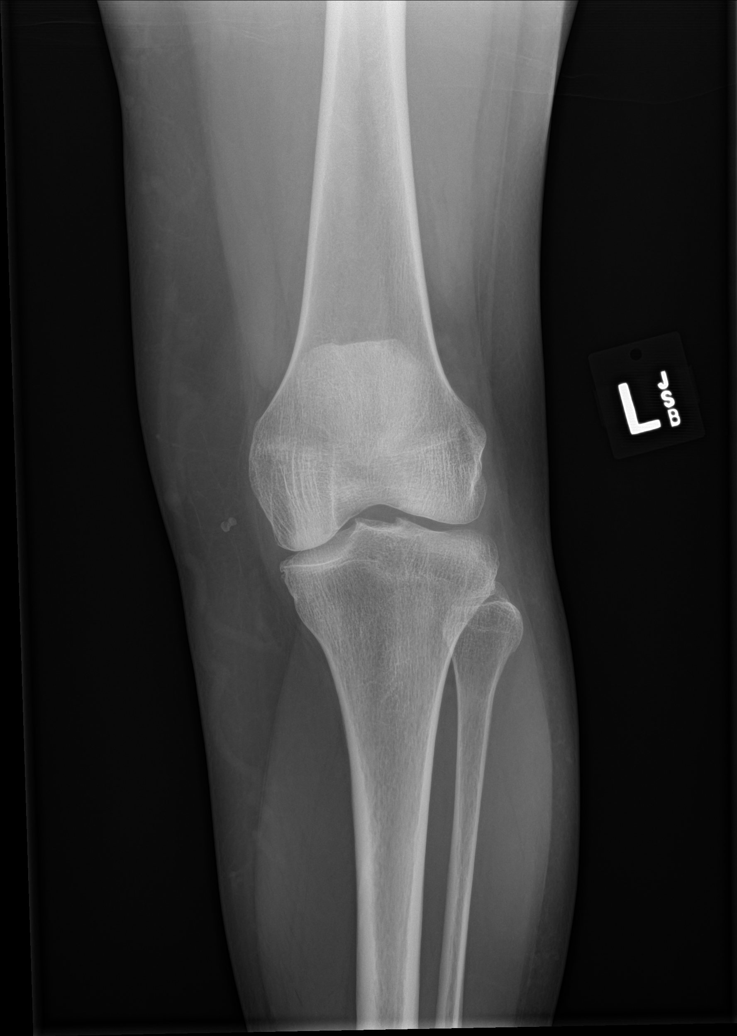

[knee obl (1 of 2)]
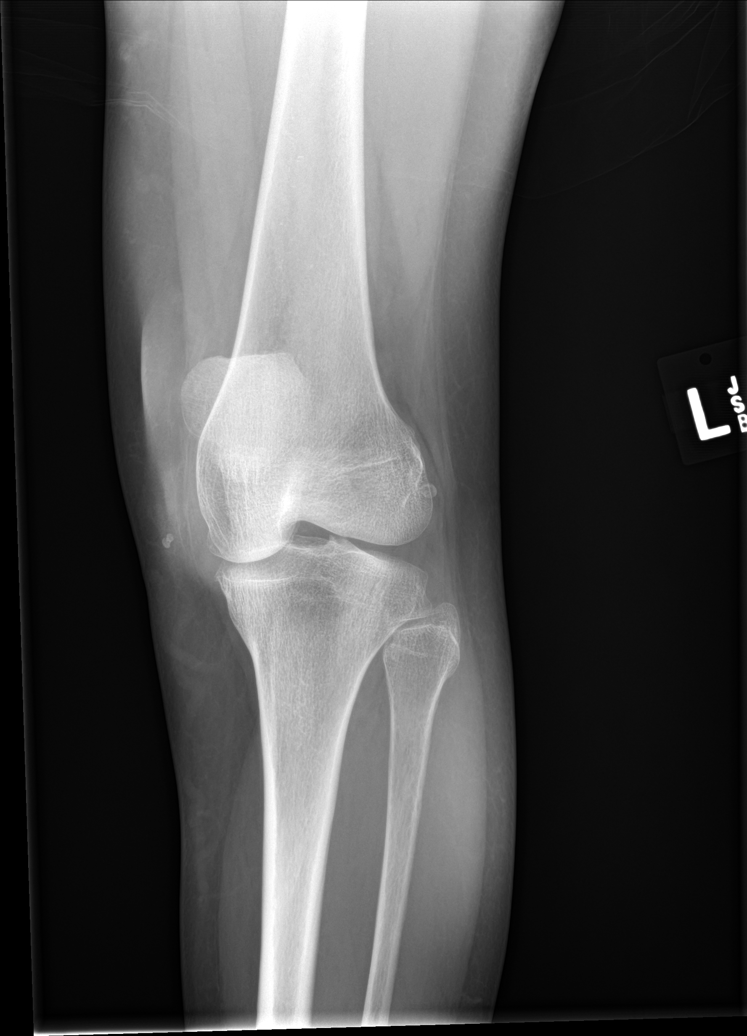

[knee obl (2 of 2)]
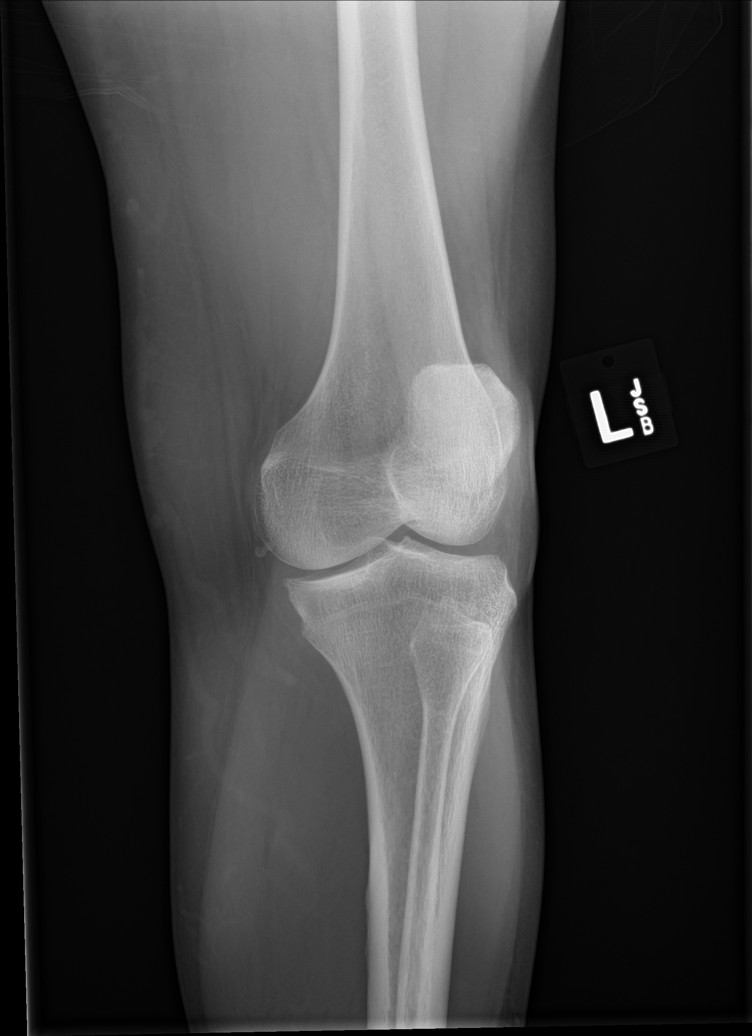

[knee lat (1 of 2)]
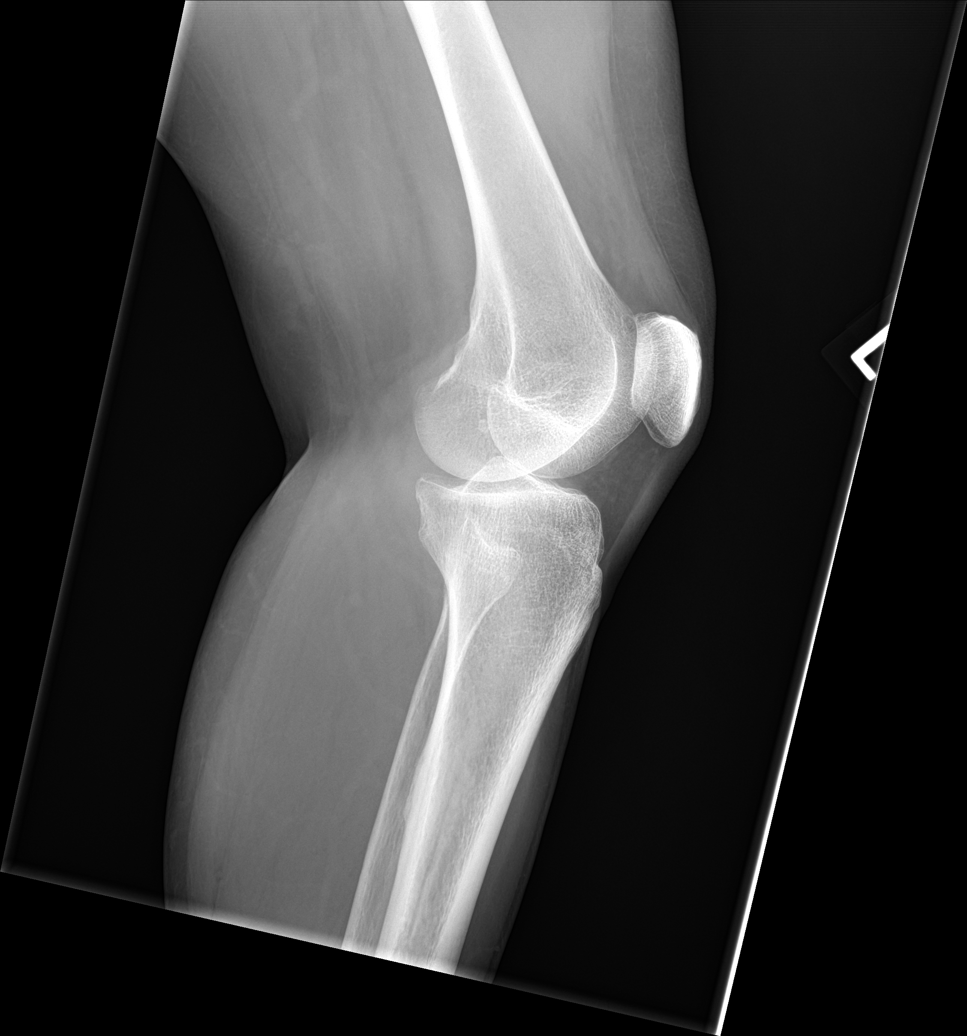

[knee lat (2 of 2)]
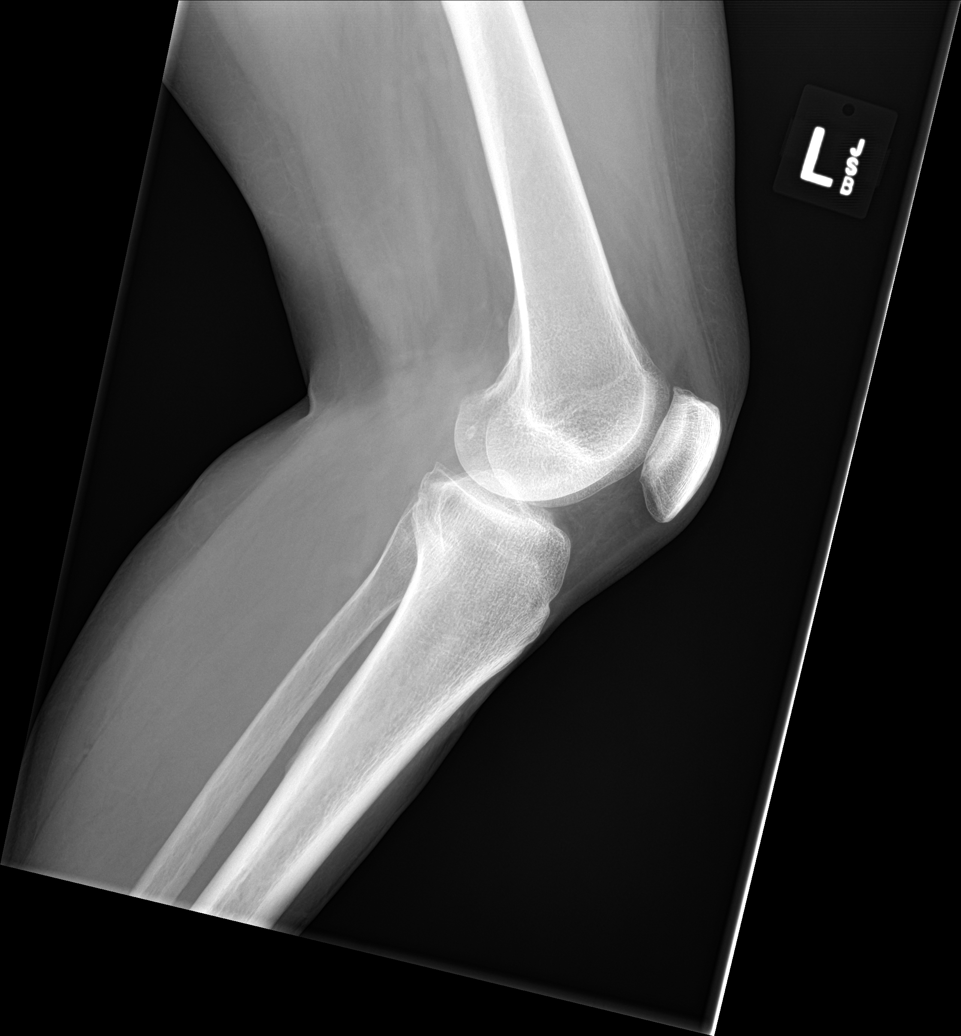

[5 of 5 positions shown; findings below may reference images not displayed]

FINDINGS: Sharpening of the tibial spines noted. There is medial compartment
narrowing and marginal spur formation. No acute fracture or
subluxation identified.
IMPRESSION: 1. Osteoarthritis.
2. No acute findings.

## 2018-03-04 IMAGING — RF DG LUMBAR SPINE 2-3V
1 series · 2 of 2 positions shown · non-contrast
Comparison: None.

CLINICAL DATA: Left far-lateral microdiscectomy at L3-4.

EXAM:
DG C-ARM 61-120 MIN; LUMBAR SPINE - 2-3 VIEW

[Series 1: run · 2 of 2 slices shown]
[im 1/2]
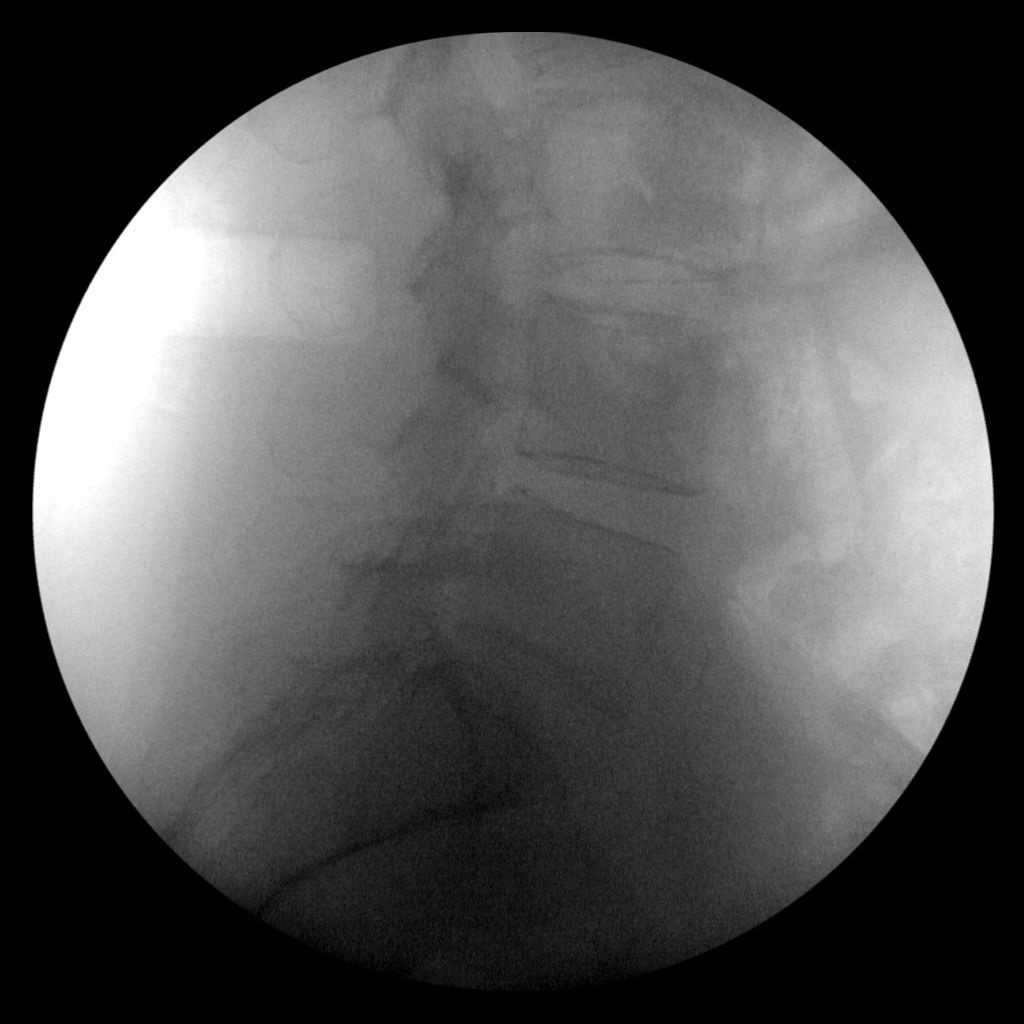
[im 2/2]
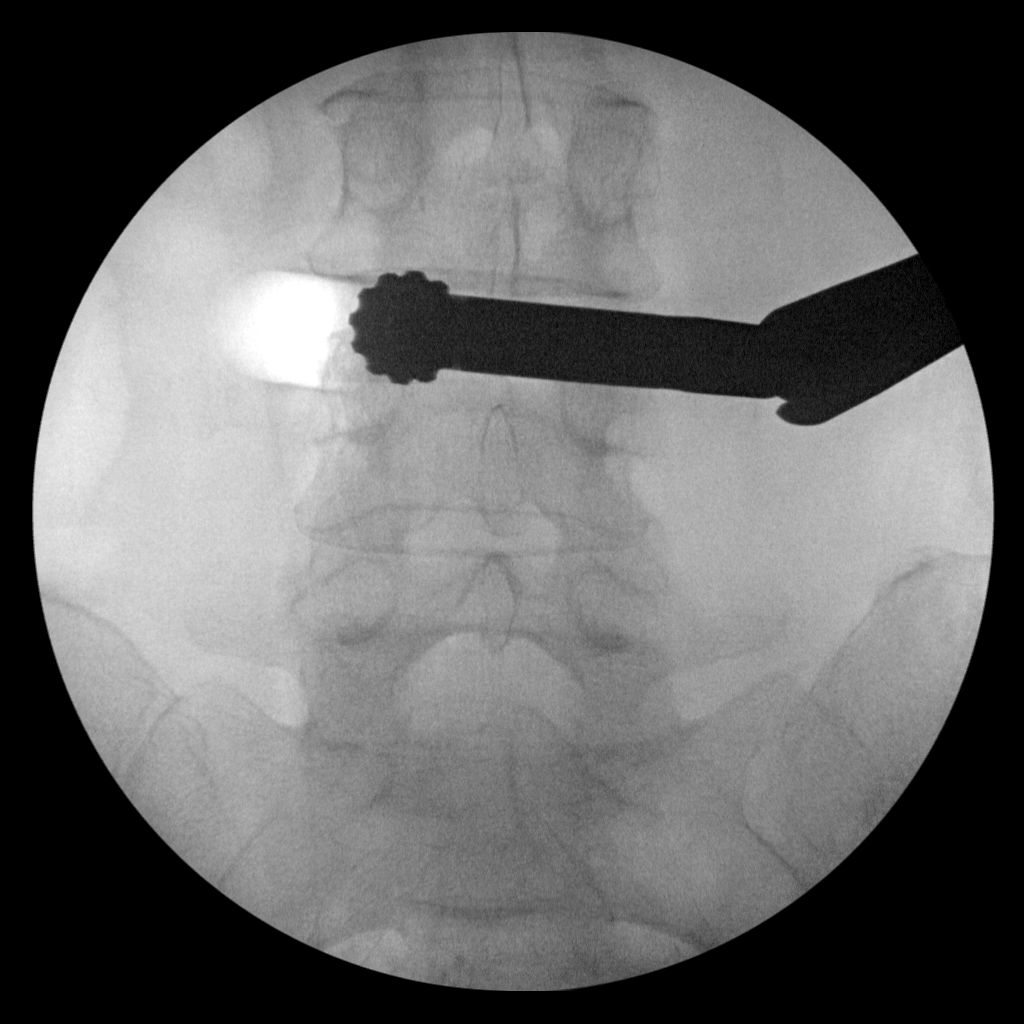

[2 of 2 positions shown; findings below may reference images not displayed]

FINDINGS: Spinal numbering as on preoperative MRI. AP and lateral views of the
lumbar spine show localization at the L3-4 disc space. Laterality is
not marked on this study.
IMPRESSION: Intraoperative localization at L3-4.

## 2018-05-24 ENCOUNTER — Other Ambulatory Visit: Payer: Self-pay | Admitting: Adult Health

## 2018-05-24 DIAGNOSIS — M542 Cervicalgia: Secondary | ICD-10-CM

## 2019-02-18 ENCOUNTER — Encounter (INDEPENDENT_AMBULATORY_CARE_PROVIDER_SITE_OTHER): Payer: Medicare Other | Admitting: Vascular Surgery

## 2019-04-18 ENCOUNTER — Encounter (INDEPENDENT_AMBULATORY_CARE_PROVIDER_SITE_OTHER): Payer: Medicare Other | Admitting: Vascular Surgery

## 2021-08-29 ENCOUNTER — Ambulatory Visit
Admission: EM | Admit: 2021-08-29 | Discharge: 2021-08-29 | Disposition: A | Discharge status: Home / Self Care | Payer: Medicare Other

## 2021-08-29 ENCOUNTER — Other Ambulatory Visit: Payer: Self-pay

## 2021-08-29 ENCOUNTER — Encounter: Payer: Self-pay | Admitting: Emergency Medicine

## 2021-08-29 DIAGNOSIS — R058 Other specified cough: Secondary | ICD-10-CM

## 2021-08-29 DIAGNOSIS — Z1152 Encounter for screening for COVID-19: Secondary | ICD-10-CM | POA: Diagnosis not present

## 2021-08-29 DIAGNOSIS — J209 Acute bronchitis, unspecified: Secondary | ICD-10-CM

## 2021-08-29 MED ORDER — ALBUTEROL SULFATE HFA 108 (90 BASE) MCG/ACT IN AERS
2.0000 | INHALATION_SPRAY | Freq: Once | RESPIRATORY_TRACT | Status: AC
  Administered 2021-08-29: 2 via RESPIRATORY_TRACT

## 2021-08-29 MED ORDER — PREDNISONE 20 MG PO TABS: 40.0000 mg | ORAL_TABLET | Freq: Every day | ORAL | 0 refills | Status: AC

## 2021-08-29 MED ORDER — PROMETHAZINE-DM 6.25-15 MG/5ML PO SYRP: 5.0000 mL | ORAL_SOLUTION | Freq: Four times a day (QID) | ORAL | 0 refills | Status: AC | PRN

## 2021-08-29 NOTE — ED Triage Notes (Signed)
Pt c/o cough, chills, bodyaches, and fever sxs started last night. Her family members tested positive for Covid.

## 2021-08-29 NOTE — Discharge Instructions (Addendum)
Your COVID 19 results should result within 3-5 days. °Negative results are immediately resulted to Mychart.  ° °Positive results will receive a follow-up call from our clinic. If symptoms are present, I recommend home quarantine until results are known.  ° °Alternate Tylenol and ibuprofen as needed for body aches and fever.  Symptom management per recommendations discussed today.  If any breathing difficulty or chest pain develops go immediately to the closest emergency department for evaluation.  °

## 2021-08-29 NOTE — ED Provider Notes (Signed)
[facility]    CSN: [id number] Arrival date & time: 08/29/21  1028      History   Chief Complaint Chief Complaint  Patient presents with   Cough   Fever   Chills   Otalgia    HPI Darlene Villa is a 60 y.o. female.   HPI Patient presents with 1 day of cough, chills, generalized body aches, fever which started last night.  Patient endorses that she has recently been exposed to a COVID-positive family member.  She is currently afebrile.  Reports that she has taken Patient is a daily smoker, has asthma which increases her risk of complications associated with influenza and/or COVID.  Past Medical History:  Diagnosis Date   Anxiety    Asthma    Back pain    Carpal tunnel syndrome    GERD (gastroesophageal reflux disease)    Hearing impaired birth   Mother had rubella   History of kidney stones 08/2016   Hypertension    Hypothyroidism    Knee pain    Thyroid disease     Patient Active Problem List   Diagnosis Date Noted   Essential hypertension 05/19/2015   Thyroid condition 05/19/2015    Past Surgical History:  Procedure Laterality Date   LUMBAR LAMINECTOMY/DECOMPRESSION MICRODISCECTOMY Left 12/29/2016   Procedure: left L3-4 far lateral MIS microdiscectomy;  Surgeon: Keith Rake, MD;  Location: [facility];  Service: Neurosurgery;  Laterality: Left;   TUBAL LIGATION  20 + years    OB History     Gravida      Para      Term      Preterm      AB      Living  2      SAB      IAB      Ectopic      Multiple      Live Births               Home Medications    Prior to Admission medications   Medication Sig Start Date End Date Taking? Authorizing Provider  acetaminophen (TYLENOL) 500 MG tablet Take 500 mg by mouth every 6 (six) hours as needed for mild pain or moderate pain.   Yes [provider]  atorvastatin (LIPITOR) 40 MG tablet Take 40 mg by mouth daily at 6 PM.   Yes [provider]  busPIRone (BUSPAR) 30  MG tablet Take 30 mg by mouth 2 (two) times daily.   Yes [provider]  hydrochlorothiazide (HYDRODIURIL) 25 MG tablet Take 1 tablet (25 mg total) by mouth daily. 02/14/16  Yes Linwood Dibbles, MD  ibuprofen (ADVIL,MOTRIN) 200 MG tablet Take 200 mg by mouth every 6 (six) hours as needed for mild pain or moderate pain.   Yes [provider]  ipratropium (ATROVENT) 0.06 % nasal spray 2 sprays by Each Nare route Three (3) times a day. 02/18/18  Yes [provider]  levothyroxine (SYNTHROID, LEVOTHROID) 50 MCG tablet Take 1 tablet (50 mcg total) by mouth daily before breakfast. 02/14/16  Yes Linwood Dibbles, MD  lisinopril (PRINIVIL,ZESTRIL) 20 MG tablet Take 1 tablet (20 mg total) by mouth daily. 02/14/16  Yes Linwood Dibbles, MD  naproxen (NAPROSYN) 250 MG tablet Take by mouth 3 (three) times daily with meals.   Yes [provider]  predniSONE (DELTASONE) 20 MG tablet Take 2 tablets (40 mg total) by mouth daily with breakfast. 08/29/21  Yes Bing Neighbors, FNP  promethazine-dextromethorphan (PROMETHAZINE-DM) 6.25-15 MG/5ML syrup Take 5 mLs by mouth 4 (four) times daily as needed for cough. 08/29/21  Yes Bing Neighbors, FNP  ranitidine (ZANTAC) 150 MG tablet Take 150 mg by mouth 2 (two) times daily.   Yes [provider]  umeclidinium-vilanterol (ANORO ELLIPTA) 62.5-25 MCG/ACT AEPB INHALE 1 PUFF BY MOUTH EVERY DAY 11/25/18  Yes [provider]  albuterol (VENTOLIN HFA) 108 (90 Base) MCG/ACT inhaler SMARTSIG:1-2 Puff(s) Via Inhaler Every 4-6 Hours PRN 07/01/21   [provider]  baclofen (LIORESAL) 10 MG tablet Take 10 mg by mouth 2 (two) times daily as needed for muscle spasms.    [provider]  ferrous sulfate 325 (65 FE) MG tablet ferrous sulfate 325 mg (65 mg iron) tablet  TAKE 1 TABLET BY MOUTH EVERY DAY    [provider]  formoterol (PERFOROMIST) 20 MCG/2ML nebulizer solution Take 20 mcg by nebulization 2 (two) times daily.     [provider]  gabapentin (NEURONTIN) 300 MG capsule Take 300 mg by mouth at bedtime.    [provider]  oxyCODONE-acetaminophen (PERCOCET) 7.5-325 MG tablet Take 1 tablet by mouth every 4 (four) hours as needed for severe pain. 12/29/16   Drinkwater, Malva Limes, PA-C    Family History Family History  Problem Relation Age of Onset   Hypertension Mother    Diabetes Mother    Congestive Heart Failure Mother    Hypertension Father    CVA Father    Hypertension Brother     Social History Social History   Tobacco Use   Smoking status: Every Day    Packs/day: 0.25    Types: Cigarettes   Smokeless tobacco: Never  Vaping Use   Vaping Use: Never used  Substance Use Topics   Alcohol use: Yes    Alcohol/week: 6.0 standard drinks    Types: 6 Cans of beer per week   Drug use: No     Allergies   Elemental sulfur and Penicillins   Review of Systems Review of Systems Pertinent negatives listed in HPI  Physical Exam Triage Vital Signs ED Triage Vitals  Enc Vitals Group     BP      Pulse      Resp      Temp      Temp src      SpO2      Weight      Height      Head Circumference      Peak Flow      Pain Score      Pain Loc      Pain Edu?      Excl. in GC?    No data found.  Updated Vital Signs BP (!) 179/84 (BP Location: Left Arm)   Pulse 82   Temp 98.2 F (36.8 C) (Oral)   Resp 18   SpO2 96%   Visual Acuity Right Eye Distance:   Left Eye Distance:   Bilateral Distance:    Right Eye Near:   Left Eye Near:    Bilateral Near:     Physical Exam General appearance: Alert, ll-appearing, no distress Head: Normocephalic, without obvious abnormality, atraumatic ENT: mucosal edema, congestion, erythematous oropharynx w/o exudate Respiratory: Respirations even , unlabored, coarse lung sound, expiratory wheeze Heart: rate and rhythm normal. No gallop or murmurs noted on exam  Abdomen: BS +, no distention, no rebound tenderness, or no  mass Extremities: No gross deformities Skin: Skin color, texture, turgor normal. No  rashes seen  Psych: Appropriate mood and affect. Neurologic: Mental status: Alert, oriented to person, place, and time, thought content appropriate.  UC Treatments / Results  Labs (all labs ordered are listed, but only abnormal results are displayed) Labs Reviewed  COVID-19, FLU A+B NAA   Narrative:    Performed at:  [address] [place], [place]  [id number] Lab Director: Jolene Schimke MD, Phone:  [phone]    EKG   Radiology No results found.  Procedures Procedures (including critical care time)  Medications Ordered in UC Medications  albuterol (VENTOLIN HFA) 108 (90 Base) MCG/ACT inhaler 2 puff (2 puffs Inhalation Given 08/29/21 1132)    Initial Impression / Assessment and Plan / UC Course  I have reviewed the triage vital signs and the nursing notes.  Pertinent labs & imaging results that were available during my care of the patient were reviewed by me and considered in my medical decision making (see chart for details).     Treating for acute bronchitis, with prednisone, albuterol 2 puffs every 4-6 hours as needed for shortness of breath and/or wheezing.  Promethazine DM for cough. COVID/flu respiratory testing pending. Patient is afebrile without distress. Recommended continue to manage symptoms of fever or body aches with tylenol  Final Clinical Impressions(s) / UC Diagnoses   Final diagnoses:  Cough with exposure to COVID-19 virus  Acute bronchitis, unspecified organism     Discharge Instructions      Your COVID 19 results should result within 3-5 days. Negative results are immediately resulted to Mychart. Positive results will receive a follow-up call from our clinic. If symptoms are present, I recommend home quarantine until results are known.  Alternate Tylenol and ibuprofen as needed for body aches and fever.  Symptom management per  recommendations discussed today.  If any breathing difficulty or chest pain develops go immediately to the closest emergency department for evaluation.      ED Prescriptions     Medication Sig Dispense Auth. Provider   predniSONE (DELTASONE) 20 MG tablet Take 2 tablets (40 mg total) by mouth daily with breakfast. 10 tablet Bing Neighbors, FNP   promethazine-dextromethorphan (PROMETHAZINE-DM) 6.25-15 MG/5ML syrup Take 5 mLs by mouth 4 (four) times daily as needed for cough. 180 mL Bing Neighbors, FNP      PDMP not reviewed this encounter.   Bing Neighbors, FNP 09/04/21 [id number]

## 2021-08-30 LAB — COVID-19, FLU A+B NAA
Influenza A, NAA: NOT DETECTED
Influenza B, NAA: NOT DETECTED
SARS-CoV-2, NAA: NOT DETECTED

## 2022-02-25 ENCOUNTER — Other Ambulatory Visit: Payer: Self-pay | Admitting: Adult Health

## 2022-02-25 DIAGNOSIS — Z1231 Encounter for screening mammogram for malignant neoplasm of breast: Secondary | ICD-10-CM

## 2022-02-28 ENCOUNTER — Ambulatory Visit
Admission: RE | Admit: 2022-02-28 | Discharge: 2022-02-28 | Disposition: A | Discharge status: Home / Self Care | Payer: Medicare Other | Source: Ambulatory Visit | Attending: Adult Health | Admitting: Adult Health

## 2022-02-28 DIAGNOSIS — Z1231 Encounter for screening mammogram for malignant neoplasm of breast: Secondary | ICD-10-CM | POA: Diagnosis present

## 2022-03-14 ENCOUNTER — Other Ambulatory Visit: Payer: Self-pay | Admitting: Adult Health

## 2022-03-14 DIAGNOSIS — R928 Other abnormal and inconclusive findings on diagnostic imaging of breast: Secondary | ICD-10-CM

## 2022-03-14 DIAGNOSIS — N63 Unspecified lump in unspecified breast: Secondary | ICD-10-CM

## 2022-04-03 ENCOUNTER — Ambulatory Visit: Payer: Medicare Other

## 2022-04-03 ENCOUNTER — Other Ambulatory Visit: Payer: Self-pay

## 2022-04-03 ENCOUNTER — Encounter: Payer: Self-pay | Admitting: Emergency Medicine

## 2022-04-03 ENCOUNTER — Ambulatory Visit: Admission: RE | Admit: 2022-04-03 | Payer: Medicare Other | Source: Ambulatory Visit

## 2022-04-03 ENCOUNTER — Ambulatory Visit
Admission: EM | Admit: 2022-04-03 | Discharge: 2022-04-03 | Disposition: A | Discharge status: Home / Self Care | Payer: Medicare Other | Attending: Family Medicine | Admitting: Family Medicine

## 2022-04-03 DIAGNOSIS — R1032 Left lower quadrant pain: Secondary | ICD-10-CM | POA: Diagnosis not present

## 2022-04-03 DIAGNOSIS — W19XXXA Unspecified fall, initial encounter: Secondary | ICD-10-CM

## 2022-04-03 DIAGNOSIS — M25562 Pain in left knee: Secondary | ICD-10-CM

## 2022-04-03 DIAGNOSIS — S39012A Strain of muscle, fascia and tendon of lower back, initial encounter: Secondary | ICD-10-CM | POA: Diagnosis not present

## 2022-04-03 MED ORDER — NAPROXEN 500 MG PO TABS: 500.0000 mg | ORAL_TABLET | Freq: Two times a day (BID) | ORAL | 0 refills | Status: AC | PRN

## 2022-04-03 MED ORDER — CYCLOBENZAPRINE HCL 10 MG PO TABS: 10.0000 mg | ORAL_TABLET | Freq: Every evening | ORAL | 0 refills | Status: AC | PRN

## 2022-04-03 NOTE — ED Provider Notes (Signed)
[facility]    CSN: [id number] Arrival date & time: 04/03/22  1112      History   Chief Complaint Chief Complaint  Patient presents with   Hip Pain    HPI Darlene Villa is a 61 y.o. female.   Medical interpreter declined for ASL, had member present with her today who she wishes to provide translation.  Presenting today with left anterior knee pain and hip and groin pain, bilateral lower back pain since a fall while mopping yesterday onto her left side.  She states she is able to bear weight without difficulty but is having stiffness and pain in these areas.  Denies swelling, discoloration, bruising, skin injury, numbness, tingling, weakness.  Taking Tylenol with minimal relief so far.   Past Medical History:  Diagnosis Date   Anxiety    Asthma    Back pain    Carpal tunnel syndrome    GERD (gastroesophageal reflux disease)    Hearing impaired birth   Mother had rubella   History of kidney stones 08/2016   Hypertension    Hypothyroidism    Knee pain    Thyroid disease     Patient Active Problem List   Diagnosis Date Noted   Essential hypertension 05/19/2015   Thyroid condition 05/19/2015    Past Surgical History:  Procedure Laterality Date   LUMBAR LAMINECTOMY/DECOMPRESSION MICRODISCECTOMY Left 12/29/2016   Procedure: left L3-4 far lateral MIS microdiscectomy;  Surgeon: Bayard Hugger, MD;  Location: [facility];  Service: Neurosurgery;  Laterality: Left;   TUBAL LIGATION  20 + years    OB History     Gravida      Para      Term      Preterm      AB      Living  2      SAB      IAB      Ectopic      Multiple      Live Births               Home Medications    Prior to Admission medications   Medication Sig Start Date End Date Taking? Authorizing Provider  cyclobenzaprine (FLEXERIL) 10 MG tablet Take 1 tablet (10 mg total) by mouth at bedtime as needed for muscle spasms. Do not drink alcohol or drive while taking this  medication.  May cause drowsiness 04/03/22  Yes Volney American, PA-C  naproxen (NAPROSYN) 500 MG tablet Take 1 tablet (500 mg total) by mouth 2 (two) times daily as needed. 04/03/22  Yes Volney American, PA-C  acetaminophen (TYLENOL) 500 MG tablet Take 500 mg by mouth every 6 (six) hours as needed for mild pain or moderate pain.    [provider]  albuterol (VENTOLIN HFA) 108 (90 Base) MCG/ACT inhaler SMARTSIG:1-2 Puff(s) Via Inhaler Every 4-6 Hours PRN 07/01/21   [provider]  atorvastatin (LIPITOR) 40 MG tablet Take 40 mg by mouth daily at 6 PM.    [provider]  baclofen (LIORESAL) 10 MG tablet Take 10 mg by mouth 2 (two) times daily as needed for muscle spasms.    [provider]  busPIRone (BUSPAR) 30 MG tablet Take 30 mg by mouth 2 (two) times daily.    [provider]  ferrous sulfate 325 (65 FE) MG tablet ferrous sulfate 325 mg (65 mg iron) tablet  TAKE 1 TABLET BY MOUTH EVERY DAY    [provider]  formoterol (PERFOROMIST) 20 MCG/2ML nebulizer solution Take 20 mcg by nebulization 2 (two) times daily.    [provider]  gabapentin (NEURONTIN) 300 MG capsule Take 300 mg by mouth at bedtime.    [provider]  hydrochlorothiazide (HYDRODIURIL) 25 MG tablet Take 1 tablet (25 mg total) by mouth daily. 02/14/16   Linwood Dibbles, MD  ibuprofen (ADVIL,MOTRIN) 200 MG tablet Take 200 mg by mouth every 6 (six) hours as needed for mild pain or moderate pain.    [provider]  ipratropium (ATROVENT) 0.06 % nasal spray 2 sprays by Each Nare route Three (3) times a day. 02/18/18   [provider]  levothyroxine (SYNTHROID, LEVOTHROID) 50 MCG tablet Take 1 tablet (50 mcg total) by mouth daily before breakfast. 02/14/16   Linwood Dibbles, MD  lisinopril (PRINIVIL,ZESTRIL) 20 MG tablet Take 1 tablet (20 mg total) by mouth daily. 02/14/16   Linwood Dibbles, MD  naproxen (NAPROSYN) 250 MG tablet Take by mouth 3 (three)  times daily with meals.    [provider]  oxyCODONE-acetaminophen (PERCOCET) 7.5-325 MG tablet Take 1 tablet by mouth every 4 (four) hours as needed for severe pain. 12/29/16   Drinkwater, Malva Limes, PA-C  predniSONE (DELTASONE) 20 MG tablet Take 2 tablets (40 mg total) by mouth daily with breakfast. 08/29/21   Bing Neighbors, FNP  promethazine-dextromethorphan (PROMETHAZINE-DM) 6.25-15 MG/5ML syrup Take 5 mLs by mouth 4 (four) times daily as needed for cough. 08/29/21   Bing Neighbors, FNP  ranitidine (ZANTAC) 150 MG tablet Take 150 mg by mouth 2 (two) times daily.    [provider]  umeclidinium-vilanterol (ANORO ELLIPTA) 62.5-25 MCG/ACT AEPB INHALE 1 PUFF BY MOUTH EVERY DAY 11/25/18   [provider]    Family History Family History  Problem Relation Age of Onset   Hypertension Mother    Diabetes Mother    Congestive Heart Failure Mother    Hypertension Father    CVA Father    Hypertension Brother     Social History Social History   Tobacco Use   Smoking status: Every Day    Packs/day: 0.25    Types: Cigarettes   Smokeless tobacco: Never  Vaping Use   Vaping Use: Never used  Substance Use Topics   Alcohol use: Yes    Alcohol/week: 6.0 standard drinks of alcohol    Types: 6 Cans of beer per week   Drug use: No     Allergies   Elemental sulfur and Penicillins   Review of Systems Review of Systems Per HPI  Physical Exam Triage Vital Signs ED Triage Vitals  Enc Vitals Group     BP 04/03/22 1130 129/77     Pulse Rate 04/03/22 1130 81     Resp 04/03/22 1130 18     Temp 04/03/22 1130 98.7 F (37.1 C)     Temp Source 04/03/22 1130 Oral     SpO2 04/03/22 1130 93 %     Weight --      Height --      Head Circumference --      Peak Flow --      Pain Score 04/03/22 1131 10     Pain Loc --      Pain Edu? --      Excl. in GC? --    No data found.  Updated Vital Signs BP 129/77 (BP Location: Right Arm)   Pulse 81   Temp 98.7 F  (37.1 C) (Oral)  Resp 18   SpO2 93%   Visual Acuity Right Eye Distance:   Left Eye Distance:   Bilateral Distance:    Right Eye Near:   Left Eye Near:    Bilateral Near:     Physical Exam Vitals and nursing note reviewed.  Constitutional:      Appearance: Normal appearance. She is not ill-appearing.  HENT:     Head: Atraumatic.  Eyes:     Extraocular Movements: Extraocular movements intact.     Conjunctiva/sclera: Conjunctivae normal.  Cardiovascular:     Rate and Rhythm: Normal rate and regular rhythm.     Heart sounds: Normal heart sounds.  Pulmonary:     Effort: Pulmonary effort is normal.     Breath sounds: Normal breath sounds.  Musculoskeletal:        General: Tenderness and signs of injury present. No swelling or deformity. Normal range of motion.     Cervical back: Normal range of motion and neck supple.     Comments: Diffuse anterior left knee tenderness to palpation particularly peripatellar.  No edema, bruising, deformity palpable and range of motion passively and actively intact.  No joint laxity, negative drawer testing McMurray's testing.  Normal range of motion of the left hip with no bony deformities.  Muscular tenderness to palpation in the thigh and the lateral hip.  Bilateral lumbar musculature tender to palpation with no midline spinal tenderness.  Negative straight leg raise bilaterally.  Skin:    General: Skin is warm and dry.     Findings: No bruising or erythema.  Neurological:     Mental Status: She is alert and oriented to person, place, and time.     Comments: Bilateral lower extremities neurovascularly intact  Psychiatric:        Mood and Affect: Mood normal.        Thought Content: Thought content normal.        Judgment: Judgment normal.    UC Treatments / Results  Labs (all labs ordered are listed, but only abnormal results are displayed) Labs Reviewed - No data to display  EKG   Radiology No results  found.  Procedures Procedures (including critical care time)  Medications Ordered in UC Medications - No data to display  Initial Impression / Assessment and Plan / UC Course  I have reviewed the triage vital signs and the nursing notes.  Pertinent labs & imaging results that were available during my care of the patient were reviewed by me and considered in my medical decision making (see chart for details).     Vitals and exam overall very reassuring, x-ray imaging deferred with shared decision making.  Suspect muscular pain and contusion from fall.  Treat with Flexeril, naproxen, supportive over-the-counter care.  Return for worsening symptoms.  Final Clinical Impressions(s) / UC Diagnoses   Final diagnoses:  Acute pain of left knee  Left groin pain  Strain of lumbar region, initial encounter  Fall, initial encounter   Discharge Instructions   None    ED Prescriptions     Medication Sig Dispense Auth. Provider   cyclobenzaprine (FLEXERIL) 10 MG tablet Take 1 tablet (10 mg total) by mouth at bedtime as needed for muscle spasms. Do not drink alcohol or drive while taking this medication.  May cause drowsiness 15 tablet Particia Nearing, [place]   naproxen (NAPROSYN) 500 MG tablet Take 1 tablet (500 mg total) by mouth 2 (two) times daily as needed. 30 tablet Particia Nearing, [place]  PDMP not reviewed this encounter.   Particia Nearing, [place] 04/03/22 [phone]

## 2022-04-03 NOTE — ED Triage Notes (Signed)
Pt reports was mopping yesterday and suddenly became hot and sweaty. Pt reports fell on left side and reports left knee pain and left hip pain/groin pain ever since. Pt able to bear weight and ambulate with steady gait. Denies hitting head or loc. No obvious deformity noted.

## 2022-10-03 ENCOUNTER — Other Ambulatory Visit: Payer: Self-pay | Admitting: *Deleted

## 2022-10-03 DIAGNOSIS — Z1211 Encounter for screening for malignant neoplasm of colon: Secondary | ICD-10-CM

## 2022-10-07 ENCOUNTER — Ambulatory Visit: Payer: Medicare Other | Admitting: Surgery
